# Patient Record
Sex: Male | Born: 1995 | Race: Black or African American | Hispanic: No | Marital: Single | Smoking: Current every day smoker
Health system: Southern US, Community
[De-identification: ages and names within clinical notes are randomized; demographics above are authoritative.]

## PROBLEM LIST (undated history)

## (undated) DIAGNOSIS — L309 Dermatitis, unspecified: Secondary | ICD-10-CM

## (undated) DIAGNOSIS — F1110 Opioid abuse, uncomplicated: Secondary | ICD-10-CM

## (undated) DIAGNOSIS — A539 Syphilis, unspecified: Secondary | ICD-10-CM

---
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
## 2018-05-18 ENCOUNTER — Encounter (HOSPITAL_COMMUNITY): Payer: Self-pay

## 2018-05-18 ENCOUNTER — Emergency Department (HOSPITAL_COMMUNITY): Admission: EM | Admit: 2018-05-18 | Discharge: 2018-05-18 | Disposition: A | Discharge status: Home / Self Care

## 2018-05-18 ENCOUNTER — Other Ambulatory Visit: Payer: Self-pay

## 2018-05-18 ENCOUNTER — Emergency Department (HOSPITAL_COMMUNITY)
Admission: EM | Admit: 2018-05-18 | Discharge: 2018-05-19 | Disposition: A | Discharge status: Home / Self Care | Payer: Self-pay | Attending: Emergency Medicine | Admitting: Emergency Medicine

## 2018-05-18 DIAGNOSIS — Z87891 Personal history of nicotine dependence: Secondary | ICD-10-CM | POA: Insufficient documentation

## 2018-05-18 DIAGNOSIS — R300 Dysuria: Secondary | ICD-10-CM | POA: Insufficient documentation

## 2018-05-18 DIAGNOSIS — N50812 Left testicular pain: Secondary | ICD-10-CM | POA: Insufficient documentation

## 2018-05-18 DIAGNOSIS — R319 Hematuria, unspecified: Secondary | ICD-10-CM | POA: Insufficient documentation

## 2018-05-18 DIAGNOSIS — R369 Urethral discharge, unspecified: Secondary | ICD-10-CM | POA: Insufficient documentation

## 2018-05-18 DIAGNOSIS — N451 Epididymitis: Secondary | ICD-10-CM | POA: Insufficient documentation

## 2018-05-18 LAB — COMPREHENSIVE METABOLIC PANEL
ALT: 16 U/L (ref 0–44)
AST: 21 U/L (ref 15–41)
Albumin: 4 g/dL (ref 3.5–5.0)
Alkaline Phosphatase: 50 U/L (ref 38–126)
Anion gap: 6 (ref 5–15)
BUN: 14 mg/dL (ref 6–20)
CHLORIDE: 102 mmol/L (ref 98–111)
CO2: 31 mmol/L (ref 22–32)
CREATININE: 1.24 mg/dL (ref 0.61–1.24)
Calcium: 9.3 mg/dL (ref 8.9–10.3)
GFR calc Af Amer: >60 mL/min (ref >60)
GFR calc non Af Amer: >60 mL/min (ref >60)
Glucose, Bld: 86 mg/dL (ref 70–99)
Potassium: 3.9 mmol/L (ref 3.5–5.1)
SODIUM: 139 mmol/L (ref 135–145)
Total Bilirubin: 0.4 mg/dL (ref 0.3–1.2)
Total Protein: 8.2 g/dL — ABNORMAL HIGH (ref 6.5–8.1)

## 2018-05-18 LAB — CBC
HCT: 44.8 % (ref 39.0–52.0)
HEMOGLOBIN: 14.7 g/dL (ref 13.0–17.0)
MCH: 30.2 pg (ref 26.0–34.0)
MCHC: 32.8 g/dL (ref 30.0–36.0)
MCV: 92.2 fL (ref 80.0–100.0)
NRBC: 0 % (ref 0.0–0.2)
PLATELETS: 316 10*3/uL (ref 150–400)
RBC: 4.86 MIL/uL (ref 4.22–5.81)
RDW: 13.2 % (ref 11.5–15.5)
WBC: 14.9 10*3/uL — ABNORMAL HIGH (ref 4.0–10.5)

## 2018-05-18 LAB — URINALYSIS, ROUTINE W REFLEX MICROSCOPIC
BILIRUBIN URINE: NEGATIVE
GLUCOSE, UA: NEGATIVE mg/dL
Ketones, ur: NEGATIVE mg/dL
NITRITE: NEGATIVE
PROTEIN: 30 mg/dL — AB
Specific Gravity, Urine: 1.02 (ref 1.005–1.030)
pH: 6 (ref 5.0–8.0)

## 2018-05-18 LAB — LIPASE, BLOOD: LIPASE: 22 U/L (ref 11–51)

## 2018-05-18 MED ORDER — OXYCODONE-ACETAMINOPHEN 5-325 MG PO TABS
1.0000 | ORAL_TABLET | Freq: Once | ORAL | Status: AC
  Administered 2018-05-18: 1 via ORAL
  Filled 2018-05-18: qty 1

## 2018-05-18 NOTE — ED Triage Notes (Signed)
Pt here for hematuria for the last 2 days, now having bilateral flank pain. No burning with urination.  A&Ox4.
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
## 2018-05-18 NOTE — ED Notes (Signed)
Pt's name called for triage and pt stated that he wanted to wait to be seen cause he was going to get something. Pt informed we would skip and keep moving down the list.

## 2018-05-19 ENCOUNTER — Emergency Department (HOSPITAL_COMMUNITY): Payer: Self-pay

## 2018-05-19 LAB — GC/CHLAMYDIA PROBE AMP (~~LOC~~) NOT AT ARMC
Chlamydia: POSITIVE — AB
Neisseria Gonorrhea: POSITIVE — AB

## 2018-05-19 MED ORDER — DOXYCYCLINE HYCLATE 100 MG PO CAPS: 100.0000 mg | ORAL_CAPSULE | Freq: Two times a day (BID) | ORAL | 0 refills | Status: AC

## 2018-05-19 MED ORDER — HYDROCODONE-ACETAMINOPHEN 5-325 MG PO TABS: 1.0000 | ORAL_TABLET | ORAL | 0 refills | Status: DC | PRN

## 2018-05-19 MED ORDER — NAPROXEN 375 MG PO TABS: 375.0000 mg | ORAL_TABLET | Freq: Two times a day (BID) | ORAL | 0 refills | Status: AC | PRN

## 2018-05-19 MED ORDER — AZITHROMYCIN 250 MG PO TABS
1000.0000 mg | ORAL_TABLET | Freq: Once | ORAL | Status: AC
  Administered 2018-05-19: 1000 mg via ORAL
  Filled 2018-05-19: qty 4

## 2018-05-19 MED ORDER — KETOROLAC TROMETHAMINE 60 MG/2ML IM SOLN
60.0000 mg | Freq: Once | INTRAMUSCULAR | Status: AC
  Administered 2018-05-19: 60 mg via INTRAMUSCULAR
  Filled 2018-05-19: qty 2

## 2018-05-19 MED ORDER — CEFTRIAXONE SODIUM 250 MG IJ SOLR
250.0000 mg | Freq: Once | INTRAMUSCULAR | Status: AC
  Administered 2018-05-19: 250 mg via INTRAMUSCULAR
  Filled 2018-05-19: qty 250

## 2018-05-19 NOTE — Discharge Instructions (Addendum)

## 2018-05-19 NOTE — ED Provider Notes (Signed)
22 year old male here with testicular pain.  Patient care assumed.  Testicular ultrasound obtained and shows uncomplicated epididymitis.  He has a history of STIs.  Will treat empirically, discussed safe sex precautions, and discharged with outpatient treatment.   Shaune PollackIsaacs, Kennadie Brenner, MD 05/19/18 [phone]

## 2018-05-19 NOTE — ED Notes (Signed)
Patient verbalizes understanding of discharge instructions. Opportunity for questioning and answers were provided. Armband removed by staff, pt discharged from ED ambulatory. Pt refused discharge vital signs.  

## 2018-05-19 NOTE — ED Notes (Signed)
Patient transported to Ultrasound 

## 2018-10-12 ENCOUNTER — Other Ambulatory Visit: Payer: Self-pay

## 2018-10-12 ENCOUNTER — Emergency Department (HOSPITAL_COMMUNITY)
Admission: EM | Admit: 2018-10-12 | Discharge: 2018-10-12 | Disposition: A | Discharge status: Home / Self Care | Payer: HRSA Program | Attending: Emergency Medicine | Admitting: Emergency Medicine

## 2018-10-12 ENCOUNTER — Encounter (HOSPITAL_COMMUNITY): Payer: Self-pay | Admitting: Emergency Medicine

## 2018-10-12 DIAGNOSIS — J020 Streptococcal pharyngitis: Secondary | ICD-10-CM | POA: Insufficient documentation

## 2018-10-12 DIAGNOSIS — R109 Unspecified abdominal pain: Secondary | ICD-10-CM | POA: Diagnosis not present

## 2018-10-12 DIAGNOSIS — F17210 Nicotine dependence, cigarettes, uncomplicated: Secondary | ICD-10-CM | POA: Insufficient documentation

## 2018-10-12 DIAGNOSIS — M7918 Myalgia, other site: Secondary | ICD-10-CM

## 2018-10-12 DIAGNOSIS — J029 Acute pharyngitis, unspecified: Secondary | ICD-10-CM | POA: Diagnosis present

## 2018-10-12 DIAGNOSIS — Z20828 Contact with and (suspected) exposure to other viral communicable diseases: Secondary | ICD-10-CM | POA: Diagnosis not present

## 2018-10-12 LAB — URINALYSIS, ROUTINE W REFLEX MICROSCOPIC
Bilirubin Urine: NEGATIVE
Glucose, UA: NEGATIVE mg/dL
Hgb urine dipstick: NEGATIVE
Ketones, ur: 20 mg/dL — AB
Leukocytes,Ua: NEGATIVE
Nitrite: NEGATIVE
Protein, ur: NEGATIVE mg/dL
Specific Gravity, Urine: 1.03 (ref 1.005–1.030)
pH: 5 (ref 5.0–8.0)

## 2018-10-12 LAB — GROUP A STREP BY PCR: Group A Strep by PCR: DETECTED — AB

## 2018-10-12 MED ORDER — DEXAMETHASONE SODIUM PHOSPHATE 10 MG/ML IJ SOLN
10.0000 mg | Freq: Once | INTRAMUSCULAR | Status: AC
  Administered 2018-10-12: 10 mg via INTRAMUSCULAR
  Filled 2018-10-12: qty 1

## 2018-10-12 MED ORDER — PENICILLIN G BENZATHINE 1200000 UNIT/2ML IM SUSP
1.2000 10*6.[IU] | Freq: Once | INTRAMUSCULAR | Status: AC
  Administered 2018-10-12: 1.2 10*6.[IU] via INTRAMUSCULAR
  Filled 2018-10-12: qty 2

## 2018-10-12 MED ORDER — KETOROLAC TROMETHAMINE 15 MG/ML IJ SOLN
15.0000 mg | Freq: Once | INTRAMUSCULAR | Status: AC
  Administered 2018-10-12: 15 mg via INTRAMUSCULAR
  Filled 2018-10-12: qty 1
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
## 2018-10-12 NOTE — Discharge Instructions (Addendum)
You were diagnosed with strep throat. You were given antibiotics in the ED.   Get help right away if: You have trouble breathing. You cannot swallow fluids, soft foods, or your saliva. You have swelling in your throat or neck that gets worse. You keep feeling sick to your stomach (nauseous). You keep throwing up (vomiting).

## 2018-10-12 NOTE — ED Triage Notes (Signed)
Pt here via [facility], reports sore throat and lower back pain x 2 days.

## 2018-10-13 LAB — NOVEL CORONAVIRUS, NAA (HOSP ORDER, SEND-OUT TO REF LAB; TAT 18-24 HRS): SARS-CoV-2, NAA: NOT DETECTED

## 2018-11-02 ENCOUNTER — Emergency Department (HOSPITAL_COMMUNITY)
Admission: EM | Admit: 2018-11-02 | Discharge: 2018-11-02 | Disposition: A | Discharge status: Home / Self Care | Payer: Self-pay | Attending: Emergency Medicine | Admitting: Emergency Medicine

## 2018-11-02 ENCOUNTER — Encounter (HOSPITAL_COMMUNITY): Payer: Self-pay | Admitting: Emergency Medicine

## 2018-11-02 ENCOUNTER — Other Ambulatory Visit: Payer: Self-pay

## 2018-11-02 ENCOUNTER — Emergency Department (HOSPITAL_COMMUNITY): Payer: Self-pay

## 2018-11-02 DIAGNOSIS — T401X1A Poisoning by heroin, accidental (unintentional), initial encounter: Secondary | ICD-10-CM | POA: Insufficient documentation

## 2018-11-02 DIAGNOSIS — R0789 Other chest pain: Secondary | ICD-10-CM | POA: Insufficient documentation

## 2018-11-02 DIAGNOSIS — F17210 Nicotine dependence, cigarettes, uncomplicated: Secondary | ICD-10-CM | POA: Insufficient documentation

## 2018-11-02 HISTORY — DX: Opioid abuse, uncomplicated: F11.10

## 2018-11-02 MED ORDER — NALOXONE HCL 4 MG/0.1ML NA LIQD: NASAL | 0 refills | Status: DC

## 2018-11-02 NOTE — ED Notes (Signed)
Patient verbalizes understanding of discharge instructions. Opportunity for questioning and answering were provided. patient discharged from ED. Pt alert and oriented x 4 , following simple commands and walking with strong and steady gait
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
## 2018-11-02 NOTE — ED Notes (Signed)
Roosvelt Maser ( pt.'s friend) : Tel. [phone].
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
## 2019-11-02 ENCOUNTER — Encounter (HOSPITAL_COMMUNITY): Payer: Self-pay | Admitting: Emergency Medicine

## 2019-11-02 ENCOUNTER — Emergency Department (HOSPITAL_COMMUNITY)
Admission: EM | Admit: 2019-11-02 | Discharge: 2019-11-02 | Disposition: A | Discharge status: Home / Self Care | Payer: Self-pay | Attending: Emergency Medicine | Admitting: Emergency Medicine

## 2019-11-02 ENCOUNTER — Other Ambulatory Visit: Payer: Self-pay

## 2019-11-02 DIAGNOSIS — Z7151 Drug abuse counseling and surveillance of drug abuser: Secondary | ICD-10-CM | POA: Insufficient documentation

## 2019-11-02 DIAGNOSIS — F1910 Other psychoactive substance abuse, uncomplicated: Secondary | ICD-10-CM | POA: Insufficient documentation

## 2019-11-02 DIAGNOSIS — F17210 Nicotine dependence, cigarettes, uncomplicated: Secondary | ICD-10-CM | POA: Insufficient documentation

## 2019-11-02 DIAGNOSIS — Z022 Encounter for examination for admission to residential institution: Secondary | ICD-10-CM | POA: Insufficient documentation

## 2019-11-02 DIAGNOSIS — R0789 Other chest pain: Secondary | ICD-10-CM | POA: Insufficient documentation

## 2019-11-02 LAB — COMPREHENSIVE METABOLIC PANEL
ALT: 307 U/L — ABNORMAL HIGH (ref 0–44)
AST: 123 U/L — ABNORMAL HIGH (ref 15–41)
Albumin: 4.3 g/dL (ref 3.5–5.0)
Alkaline Phosphatase: 54 U/L (ref 38–126)
Anion gap: 13 (ref 5–15)
BUN: 13 mg/dL (ref 6–20)
CO2: 21 mmol/L — ABNORMAL LOW (ref 22–32)
Calcium: 9.7 mg/dL (ref 8.9–10.3)
Chloride: 103 mmol/L (ref 98–111)
Creatinine, Ser: 1.18 mg/dL (ref 0.61–1.24)
GFR calc Af Amer: >60 mL/min (ref >60)
GFR calc non Af Amer: >60 mL/min (ref >60)
Glucose, Bld: 92 mg/dL (ref 70–99)
Potassium: 3.6 mmol/L (ref 3.5–5.1)
Sodium: 137 mmol/L (ref 135–145)
Total Bilirubin: 0.8 mg/dL (ref 0.3–1.2)
Total Protein: 8.5 g/dL — ABNORMAL HIGH (ref 6.5–8.1)

## 2019-11-02 LAB — CBC
HCT: 47.4 % (ref 39.0–52.0)
Hemoglobin: 16 g/dL (ref 13.0–17.0)
MCH: 31.3 pg (ref 26.0–34.0)
MCHC: 33.8 g/dL (ref 30.0–36.0)
MCV: 92.6 fL (ref 80.0–100.0)
Platelets: 356 10*3/uL (ref 150–400)
RBC: 5.12 MIL/uL (ref 4.22–5.81)
RDW: 12.6 % (ref 11.5–15.5)
WBC: 8.6 10*3/uL (ref 4.0–10.5)
nRBC: 0 % (ref 0.0–0.2)

## 2019-11-02 LAB — RAPID URINE DRUG SCREEN, HOSP PERFORMED
Amphetamines: POSITIVE — AB
Barbiturates: NOT DETECTED
Benzodiazepines: NOT DETECTED
Cocaine: POSITIVE — AB
Opiates: NOT DETECTED
Tetrahydrocannabinol: POSITIVE — AB

## 2019-11-02 LAB — SALICYLATE LEVEL: Salicylate Lvl: <7 mg/dL — ABNORMAL LOW (ref 7.0–30.0)

## 2019-11-02 LAB — ACETAMINOPHEN LEVEL: Acetaminophen (Tylenol), Serum: <10 ug/mL — ABNORMAL LOW (ref 10–30)

## 2019-11-02 LAB — ETHANOL: Alcohol, Ethyl (B): <10 mg/dL (ref <10)

## 2019-11-02 NOTE — Discharge Instructions (Addendum)
Patient is medically clear at this time to pursue drug rehab/detox. Patient appears to be very motivated.  Please accept him to rehab facility for detoxification.

## 2019-11-02 NOTE — ED Triage Notes (Signed)
Patient arrives to ED wanting to detox. Patient has reached out to places to help with his detox but they want him medically cleared. 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 Last use this morning.

## 2019-11-02 NOTE — ED Notes (Signed)
Patient verbalizes understanding of discharge instructions. Opportunity for questioning and answers were provided. Armband removed by staff, pt discharged from ED to home 
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
## 2019-11-16 ENCOUNTER — Emergency Department (HOSPITAL_COMMUNITY)
Admission: EM | Admit: 2019-11-16 | Discharge: 2019-11-17 | Disposition: A | Discharge status: Home / Self Care | Payer: Medicaid Other | Attending: Emergency Medicine | Admitting: Emergency Medicine

## 2019-11-16 ENCOUNTER — Encounter (HOSPITAL_COMMUNITY): Payer: Self-pay | Admitting: Emergency Medicine

## 2019-11-16 ENCOUNTER — Other Ambulatory Visit: Payer: Self-pay

## 2019-11-16 DIAGNOSIS — F17210 Nicotine dependence, cigarettes, uncomplicated: Secondary | ICD-10-CM | POA: Insufficient documentation

## 2019-11-16 DIAGNOSIS — F19129 Other psychoactive substance abuse with intoxication, unspecified: Secondary | ICD-10-CM | POA: Insufficient documentation

## 2019-11-16 DIAGNOSIS — Z79899 Other long term (current) drug therapy: Secondary | ICD-10-CM | POA: Insufficient documentation

## 2019-11-16 DIAGNOSIS — F1910 Other psychoactive substance abuse, uncomplicated: Secondary | ICD-10-CM

## 2019-11-16 LAB — COMPREHENSIVE METABOLIC PANEL
ALT: 198 U/L — ABNORMAL HIGH (ref 0–44)
AST: 92 U/L — ABNORMAL HIGH (ref 15–41)
Albumin: 3.9 g/dL (ref 3.5–5.0)
Alkaline Phosphatase: 63 U/L (ref 38–126)
Anion gap: 10 (ref 5–15)
BUN: 12 mg/dL (ref 6–20)
CO2: 24 mmol/L (ref 22–32)
Calcium: 8.8 mg/dL — ABNORMAL LOW (ref 8.9–10.3)
Chloride: 103 mmol/L (ref 98–111)
Creatinine, Ser: 1.19 mg/dL (ref 0.61–1.24)
GFR calc Af Amer: >60 mL/min (ref >60)
GFR calc non Af Amer: >60 mL/min (ref >60)
Glucose, Bld: 100 mg/dL — ABNORMAL HIGH (ref 70–99)
Potassium: 4 mmol/L (ref 3.5–5.1)
Sodium: 137 mmol/L (ref 135–145)
Total Bilirubin: 0.4 mg/dL (ref 0.3–1.2)
Total Protein: 7.4 g/dL (ref 6.5–8.1)

## 2019-11-16 LAB — RAPID URINE DRUG SCREEN, HOSP PERFORMED
Amphetamines: POSITIVE — AB
Barbiturates: NOT DETECTED
Benzodiazepines: NOT DETECTED
Cocaine: NOT DETECTED
Opiates: NOT DETECTED
Tetrahydrocannabinol: POSITIVE — AB

## 2019-11-16 LAB — CBC
HCT: 46.9 % (ref 39.0–52.0)
Hemoglobin: 15.7 g/dL (ref 13.0–17.0)
MCH: 31.2 pg (ref 26.0–34.0)
MCHC: 33.5 g/dL (ref 30.0–36.0)
MCV: 93.2 fL (ref 80.0–100.0)
Platelets: 352 10*3/uL (ref 150–400)
RBC: 5.03 MIL/uL (ref 4.22–5.81)
RDW: 12.7 % (ref 11.5–15.5)
WBC: 7.2 10*3/uL (ref 4.0–10.5)
nRBC: 0 % (ref 0.0–0.2)

## 2019-11-16 LAB — HIV ANTIBODY (ROUTINE TESTING W REFLEX): HIV Screen 4th Generation wRfx: NONREACTIVE

## 2019-11-16 LAB — ETHANOL: Alcohol, Ethyl (B): <10 mg/dL (ref <10)

## 2019-11-16 NOTE — ED Triage Notes (Addendum)
Pt presents to ED POV. Pt wanting to detox from amphetamines and heroin. Pt states last use yesterday. Pt states that he was sent home with medical clearance paper for facility on 6/11 but facility also wanted him to detox here. Pt also reports possible HIV exposure  COWS 7

## 2019-11-17 NOTE — Discharge Instructions (Signed)
Follow up with the resources provided to get treatment for multi-drug addiction.
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
## 2019-11-24 ENCOUNTER — Encounter (HOSPITAL_COMMUNITY): Payer: Self-pay

## 2019-11-24 ENCOUNTER — Emergency Department (HOSPITAL_COMMUNITY)
Admission: EM | Admit: 2019-11-24 | Discharge: 2019-11-24 | Disposition: A | Discharge status: Home / Self Care | Payer: Medicaid Other | Attending: Emergency Medicine | Admitting: Emergency Medicine

## 2019-11-24 ENCOUNTER — Other Ambulatory Visit: Payer: Self-pay

## 2019-11-24 DIAGNOSIS — J020 Streptococcal pharyngitis: Secondary | ICD-10-CM

## 2019-11-24 DIAGNOSIS — F17210 Nicotine dependence, cigarettes, uncomplicated: Secondary | ICD-10-CM | POA: Insufficient documentation

## 2019-11-24 DIAGNOSIS — J029 Acute pharyngitis, unspecified: Secondary | ICD-10-CM | POA: Insufficient documentation

## 2019-11-24 DIAGNOSIS — R509 Fever, unspecified: Secondary | ICD-10-CM | POA: Insufficient documentation

## 2019-11-24 LAB — GROUP A STREP BY PCR: Group A Strep by PCR: DETECTED — AB

## 2019-11-24 MED ORDER — AMOXICILLIN 500 MG PO CAPS
500.0000 mg | ORAL_CAPSULE | Freq: Two times a day (BID) | ORAL | 0 refills | Status: DC
Start: 2019-11-24 — End: 2020-01-19

## 2019-11-24 MED ORDER — AMOXICILLIN 500 MG PO CAPS
500.0000 mg | ORAL_CAPSULE | Freq: Once | ORAL | Status: AC
  Administered 2019-11-24: 500 mg via ORAL
  Filled 2019-11-24: qty 1

## 2019-11-24 MED ORDER — ACETAMINOPHEN 325 MG PO TABS
650.0000 mg | ORAL_TABLET | Freq: Once | ORAL | Status: AC
  Administered 2019-11-24: 650 mg via ORAL
  Filled 2019-11-24: qty 2
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
## 2019-11-24 NOTE — ED Triage Notes (Signed)
Pt reports sore throat and a white coating in the throat. Reports last using meth around 1p today.

## 2019-11-24 NOTE — ED Notes (Signed)
Pt able to swallow without coughing, stopping or other concerns.

## 2019-12-19 ENCOUNTER — Encounter: Payer: Medicaid Other | Admitting: Internal Medicine

## 2019-12-19 ENCOUNTER — Telehealth: Payer: Self-pay | Admitting: Pharmacy Technician

## 2019-12-19 NOTE — Telephone Encounter (Addendum)
[facility] Patient Product/process development scientist completed.    The patient is [place]-family planning) and will need patient assistance for medication.  May be insured under father's [employer].  Will need copy of card.  We can complete the application and will need to meet with the patient for signatures and income documentation.  Netty Starring. Dimas Aguas CPhT Specialty Pharmacy Patient [facility] for Infectious Disease Phone: [phone] Fax:  [phone]

## 2020-01-01 ENCOUNTER — Telehealth: Payer: Self-pay | Admitting: Pharmacy Technician

## 2020-01-01 NOTE — Telephone Encounter (Signed)
RCID Patient Product/process development scientist completed.    The patient is uninsured (may be insured under father who has [place]) and will need patient assistance for medication.  If insured, we will need a copy of the card.  We can complete the application and will need to meet with the patient for signatures and income documentation.  Netty Starring. Dimas Aguas CPhT Specialty Pharmacy Patient [facility] for Infectious Disease Phone: [phone] Fax:  [phone]

## 2020-01-02 ENCOUNTER — Ambulatory Visit (INDEPENDENT_AMBULATORY_CARE_PROVIDER_SITE_OTHER): Payer: Self-pay | Admitting: Internal Medicine

## 2020-01-02 ENCOUNTER — Other Ambulatory Visit: Payer: Self-pay

## 2020-01-02 ENCOUNTER — Encounter: Payer: Self-pay | Admitting: Internal Medicine

## 2020-01-02 DIAGNOSIS — B182 Chronic viral hepatitis C: Secondary | ICD-10-CM | POA: Insufficient documentation

## 2020-01-02 NOTE — Progress Notes (Signed)
[facility]   CC: consideration for treatment for chronic hepatitis C  HPI:  +Ronald Greene is a 24 y.o. male who presents for initial evaluation and management of chronic hepatitis C.  Patient tested positive 3-4 years ago at an outside clinic. Hepatitis C-associated risk factors present are: IV drug abuse (details: he has recently used heroin but is no longer using and feels he is in a good place to remain drug free). Patient denies history of blood transfusion. Patient has not had other studies performed. Results: none. Patient has not had prior treatment for Hepatitis C. Patient does not have a past history of liver disease. Patient does not have a family history of liver disease. Patient does not  have associated signs or symptoms related to liver disease.      Patient does not have documented immunity to Hepatitis A. Patient does not have documented immunity to Hepatitis B.    Review of Systems:  Constitutional: negative for fevers and chills Gastrointestinal: negative for nausea and diarrhea Integument/breast: negative for rash All other systems reviewed and are negative       Past Medical History:  Diagnosis Date  . Heroin abuse (HCC)     Prior to Admission medications   Medication Sig Start Date End Date Taking? Authorizing Provider  amoxicillin (AMOXIL) 500 MG capsule Take 1 capsule (500 mg total) by mouth 2 (two) times daily. Patient not taking: Reported on 01/02/2020 11/24/19   Zadie Rhine, MD  naloxone [place]) nasal spray 4 mg/0.1 mL 4 mg intranasally once for overdose Patient not taking: Reported on 01/02/2020 11/02/18   Elpidio Anis, PA-C    Allergies  Allergen Reactions  . Aloe Hives    Social History   Tobacco Use  . Smoking status: Current Every Day Smoker    Packs/day: 1.00    Types: Cigarettes    Last attempt to quit: 06/20/2012    Years since quitting: 7.5  . Smokeless tobacco: Never Used  Vaping Use  . Vaping  Use: Never used  Substance Use Topics  . Alcohol use: No  . Drug use: Yes    Types: Marijuana    Comment: Occassional use   FMH: no liver disease   Objective:  Constitutional: in no apparent distress,  Vitals:   01/02/20 1004  BP: 118/72  Pulse: 98  Temp: 97.7 F (36.5 C)  SpO2: 99%   Eyes: anicteric Cardiovascular: Cor RRR Respiratory: clear Musculoskeletal: no pedal edema noted Skin: negatives: no rash; no porphyria cutanea tarda Lymphatic: no cervical lymphadenopathy   Laboratory Genotype: No results found for: HCVGENOTYPE HCV viral load: No results found for: HCVQUANT Lab Results  Component Value Date   WBC 7.2 11/16/2019   HGB 15.7 11/16/2019   HCT 46.9 11/16/2019   MCV 93.2 11/16/2019   PLT 352 11/16/2019    Lab Results  Component Value Date   CREATININE 1.19 11/16/2019   BUN 12 11/16/2019   NA 137 11/16/2019   K 4.0 11/16/2019   CL 103 11/16/2019   CO2 24 11/16/2019    Lab Results  Component Value Date   ALT 198 (H) 11/16/2019   AST 92 (H) 11/16/2019   ALKPHOS 63 11/16/2019     Labs and history reviewed and show CHILD-PUGH unknown  5-6 points: Child class A 7-9 points: Child class B 10-15 points: Child class C  Lab Results  Component Value Date   BILITOT 0.4 11/16/2019   ALBUMIN 3.9 11/16/2019  Assessment: New Patient with Chronic Hepatitis C genotype unknown, untreated.  I discussed with the patient the lab findings that confirm chronic hepatitis C as well as the natural history and progression of disease including about 30% of people who develop cirrhosis of the liver if left untreated and once cirrhosis is established there is a 2-7% risk per year of liver cancer and liver failure.  I discussed the importance of treatment and benefits in reducing the risk, even if significant liver fibrosis exists.   Plan: 1) Patient counseled extensively on limiting acetaminophen to no more than 2 grams daily, avoidance of alcohol. 2) Transmission  discussed with patient including sexual transmission, sharing razors and toothbrush.   3) Will prescribe appropriate medication after labs have returned 4) Hepatitis A and B titers 5) will follow up after starting medication, will alert him when it is available

## 2020-01-02 NOTE — Telephone Encounter (Signed)
Confirmed [place]'s [place] family [place] and will not cover needed medication.  Gathered patient assistance applications and signatures.  Netty Starring. Dimas Aguas CPhT Specialty Pharmacy Patient [facility] for Infectious Disease Phone: [phone] Fax:  [phone]

## 2020-01-03 LAB — URINE CYTOLOGY ANCILLARY ONLY
Chlamydia: NEGATIVE
Comment: NEGATIVE
Comment: NORMAL
Neisseria Gonorrhea: NEGATIVE

## 2020-01-06 LAB — LIVER FIBROSIS, FIBROTEST-ACTITEST
ALT: 265 U/L — ABNORMAL HIGH (ref 9–46)
Alpha-2-Macroglobulin: 207 mg/dL (ref 106–279)
Apolipoprotein A1: 119 mg/dL (ref 94–176)
Bilirubin: 0.3 mg/dL (ref 0.2–1.2)
Fibrosis Score: 0.2
GGT: 53 U/L (ref 3–70)
Haptoglobin: 73 mg/dL (ref 43–212)
Necroinflammat ACT Score: 0.85
Reference ID: 3511597

## 2020-01-06 LAB — HEPATITIS B CORE ANTIBODY, TOTAL: Hep B Core Total Ab: NONREACTIVE

## 2020-01-06 LAB — RPR: RPR Ser Ql: REACTIVE — AB

## 2020-01-06 LAB — HEPATITIS B SURFACE ANTIGEN: Hepatitis B Surface Ag: NONREACTIVE

## 2020-01-06 LAB — HIV ANTIBODY (ROUTINE TESTING W REFLEX): HIV 1&2 Ab, 4th Generation: NONREACTIVE

## 2020-01-06 LAB — RPR TITER: RPR Titer: 1:2 {titer} — ABNORMAL HIGH

## 2020-01-06 LAB — HEPATITIS C GENOTYPE

## 2020-01-06 LAB — FLUORESCENT TREPONEMAL AB(FTA)-IGG-BLD: Fluorescent Treponemal ABS: REACTIVE — AB

## 2020-01-06 LAB — HEPATITIS A ANTIBODY, TOTAL: Hepatitis A AB,Total: REACTIVE — AB

## 2020-01-06 LAB — HEPATITIS B SURFACE ANTIBODY,QUALITATIVE: Hep B S Ab: NONREACTIVE

## 2020-01-19 ENCOUNTER — Other Ambulatory Visit: Payer: Self-pay

## 2020-01-19 ENCOUNTER — Emergency Department (HOSPITAL_COMMUNITY)
Admission: EM | Admit: 2020-01-19 | Discharge: 2020-01-19 | Disposition: A | Discharge status: Home / Self Care | Payer: Medicaid Other | Attending: Emergency Medicine | Admitting: Emergency Medicine

## 2020-01-19 ENCOUNTER — Encounter (HOSPITAL_COMMUNITY): Payer: Self-pay | Admitting: Student

## 2020-01-19 DIAGNOSIS — F17210 Nicotine dependence, cigarettes, uncomplicated: Secondary | ICD-10-CM | POA: Insufficient documentation

## 2020-01-19 DIAGNOSIS — L237 Allergic contact dermatitis due to plants, except food: Secondary | ICD-10-CM

## 2020-01-19 MED ORDER — PREDNISONE 10 MG PO TABS
ORAL_TABLET | ORAL | 0 refills | Status: DC
Start: 2020-01-19 — End: 2020-10-04

## 2020-01-19 NOTE — Discharge Instructions (Addendum)
You were seen in the emergency department today for poison ivy.  We are sending you home with prednisone to take over an extended period of time.  Please take 20 mg (2 tablets) for the first 5 days and then 10 mg (1 tablet) for the next 5 days following for a total of 10 days.  We have prescribed you new medication(s) today. Discuss the medications prescribed today with your pharmacist as they can have adverse effects and interactions with your other medicines including over the counter and prescribed medications. Seek medical evaluation if you start to experience new or abnormal symptoms after taking one of these medicines, seek care immediately if you start to experience difficulty breathing, feeling of your throat closing, facial swelling, or rash as these could be indications of a more serious allergic reaction  Please try not to touch the area.  Please follow with your primary care provider within 3 days for reevaluation.  Return to the ER for new or worsening symptoms including but not limited to spreading rash, increased redness, drainage from the rash, change in your vision, eye pain, or any other concerns.
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
## 2020-01-19 NOTE — ED Triage Notes (Addendum)
Patient reports poison ivy rash starting two days ago. Patient says lotions are not working. No difficulty breathing.

## 2020-02-08 ENCOUNTER — Telehealth: Payer: Self-pay

## 2020-02-08 NOTE — Telephone Encounter (Signed)
Called patient to get a follow up scheduled, no answer or voicemail set up at this time

## 2020-02-12 ENCOUNTER — Telehealth: Payer: Self-pay | Admitting: *Deleted

## 2020-02-12 NOTE — Telephone Encounter (Signed)
Hi, Marcus. Dr Luciana Axe didn't have anything available in the dates you listed, but he does have 10/13 at 11:00.  Does that work?  If not, please call [phone]. Thank you! Chalese Peach ===View-only below this line===   ----- Message -----      From:Sadler Chilton Si      Sent:02/12/2020 12:54 AM EDT        HF:SFSELT Jackolyn Confer, MD   Subject:Appointment Request  Appointment Request From: Crist Fat  With Provider: Gardiner Barefoot, MD [facility] Infectious Disease]  Preferred Date Range: 02/13/2020 - 03/04/2020  Preferred Times: Any Time  Reason for visit: Office Visit  Comments: Hepatitis treatment, fibrosis and anyteat results that can be treated

## 2020-03-05 ENCOUNTER — Ambulatory Visit: Payer: Medicaid Other | Admitting: Internal Medicine

## 2020-03-13 ENCOUNTER — Other Ambulatory Visit: Payer: Self-pay

## 2020-03-13 ENCOUNTER — Emergency Department (HOSPITAL_COMMUNITY)
Admission: EM | Admit: 2020-03-13 | Discharge: 2020-03-13 | Disposition: A | Discharge status: Home / Self Care | Payer: Medicaid Other | Attending: Emergency Medicine | Admitting: Emergency Medicine

## 2020-03-13 ENCOUNTER — Encounter (HOSPITAL_COMMUNITY): Payer: Self-pay | Admitting: Emergency Medicine

## 2020-03-13 DIAGNOSIS — R070 Pain in throat: Secondary | ICD-10-CM

## 2020-03-13 DIAGNOSIS — Z720 Tobacco use: Secondary | ICD-10-CM

## 2020-03-13 DIAGNOSIS — F17210 Nicotine dependence, cigarettes, uncomplicated: Secondary | ICD-10-CM | POA: Insufficient documentation

## 2020-03-13 NOTE — ED Triage Notes (Addendum)
Patient arrives to ED with complaints of sore throat, swollen lymph nodes, and swollen cheeks x1 week. Pt states it has not gotten better and he is now weak. Denies chills and fever but states he cannot swallow as easy.
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
## 2020-03-13 NOTE — Discharge Planning (Signed)
RNCM contacted Peer Support Arlys John).  Arlys John will be here to interview pt within the hour.

## 2020-03-13 NOTE — Discharge Instructions (Addendum)
The cause of your symptoms was not identified today. Please follow-up with your family doctor or [facility] for further evaluation. Get rechecked immediately if you develop difficulty breathing, difficulty swallowing or new concerning symptoms.  You can help your throat get better by stopping smoking cigarettes and marijuana.

## 2020-03-13 NOTE — Patient Outreach (Signed)
ED Peer Support Specialist Patient Intake (Complete at intake & 30-60 Day Follow-up)  Name: Ronald Greene  MRN: [id number]  Age: 24 y.o.   Date of Admission: 03/13/2020  Intake:   Comments:      Primary Reason Admitted: unknown   Lab values: Alcohol/ETOH: Not completed Positive UDS? Drug screen not completed Amphetamines: Drug screen not completed Barbiturates: Drug screen not completed Benzodiazepines: Drug screen not completed Cocaine: Drug screen not completed Opiates: Drug screen not completed Cannabinoids: Drug screen not completed  Demographic information: Gender: Male Ethnicity: African American Marital Status: Single Insurance Status: Uninsured/Self-pay [employer] (Work Neurosurgeon, Physicist, medical, etc.: Yes Hospital doctor) Lives with: Partner/Spouse Living situation: House/Apartment  Reported Patient History: Patient reported health conditions: None Patient aware of HIV and hepatitis status: No  In past year, has patient visited ED for any reason? No  Number of ED visits:    Reason(s) for visit:    In past year, has patient been hospitalized for any reason? No  Number of hospitalizations:    Reason(s) for hospitalization:    In past year, has patient been arrested? No  Number of arrests:    Reason(s) for arrest:    In past year, has patient been incarcerated? No  Number of incarcerations:    Reason(s) for incarceration:    In past year, has patient received medication-assisted treatment? No  In past year, patient received the following treatments:    In past year, has patient received any harm reduction services? No  Did this include any of the following?    In past year, has patient received care from a mental health provider for diagnosis other than SUD? No  In past year, is this first time patient has overdosed? No  Number of past overdoses:    In past year, is this first time patient has been hospitalized for  an overdose? No  Number of hospitalizations for overdose(s):    Is patient currently receiving treatment for a mental health diagnosis? No  Patient reports experiencing difficulty participating in SUD treatment: No    Most important reason(s) for this difficulty?    Has patient received prior services for treatment? No  In past, patient has received services from following agencies:    Plan of Care:  Suggested follow up at these agencies/treatment centers: Other (comment)  Other information: [facility]PSS met with Pt to monitor services. [facility]PSS was able to complete series of questions to gain more information. [facility]PSS was made aware that Pt was having sore throat issues. CPSS left contact information for Pt if needed for any resources in community.   [name], CPSS  03/13/2020 11:46 AM

## 2020-03-22 ENCOUNTER — Other Ambulatory Visit: Payer: Self-pay

## 2020-03-22 ENCOUNTER — Emergency Department (HOSPITAL_COMMUNITY): Payer: Self-pay

## 2020-03-22 ENCOUNTER — Emergency Department (HOSPITAL_COMMUNITY)
Admission: EM | Admit: 2020-03-22 | Discharge: 2020-03-22 | Disposition: A | Discharge status: Home / Self Care | Payer: Self-pay | Attending: Emergency Medicine | Admitting: Emergency Medicine

## 2020-03-22 ENCOUNTER — Encounter (HOSPITAL_COMMUNITY): Payer: Self-pay | Admitting: Emergency Medicine

## 2020-03-22 DIAGNOSIS — F1590 Other stimulant use, unspecified, uncomplicated: Secondary | ICD-10-CM | POA: Insufficient documentation

## 2020-03-22 DIAGNOSIS — F17210 Nicotine dependence, cigarettes, uncomplicated: Secondary | ICD-10-CM | POA: Insufficient documentation

## 2020-03-22 DIAGNOSIS — R52 Pain, unspecified: Secondary | ICD-10-CM

## 2020-03-22 DIAGNOSIS — L03011 Cellulitis of right finger: Secondary | ICD-10-CM | POA: Insufficient documentation

## 2020-03-22 MED ORDER — CEPHALEXIN 500 MG PO CAPS: 500.0000 mg | ORAL_CAPSULE | Freq: Three times a day (TID) | ORAL | 0 refills | Status: AC

## 2020-03-22 MED ORDER — LIDOCAINE HCL (PF) 1 % IJ SOLN
5.0000 mL | Freq: Once | INTRAMUSCULAR | Status: AC
  Administered 2020-03-22: 5 mL
  Filled 2020-03-22: qty 5
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
## 2020-03-22 NOTE — ED Notes (Signed)
Pt thumb dressed in gauze and tape.

## 2020-03-22 NOTE — ED Triage Notes (Addendum)
C/o pain, swelling, and discoloration to R thumb (including nail bed) x 4 days.  States he believes he may have been bit by a spider.  Denies fever and chills.

## 2020-05-08 ENCOUNTER — Emergency Department (HOSPITAL_COMMUNITY)
Admission: EM | Admit: 2020-05-08 | Discharge: 2020-05-08 | Disposition: A | Discharge status: Home / Self Care | Payer: Medicaid Other | Attending: Emergency Medicine | Admitting: Emergency Medicine

## 2020-05-08 ENCOUNTER — Encounter (HOSPITAL_COMMUNITY): Payer: Self-pay

## 2020-05-08 ENCOUNTER — Other Ambulatory Visit: Payer: Self-pay

## 2020-05-08 DIAGNOSIS — L02413 Cutaneous abscess of right upper limb: Secondary | ICD-10-CM | POA: Insufficient documentation

## 2020-05-08 DIAGNOSIS — F17210 Nicotine dependence, cigarettes, uncomplicated: Secondary | ICD-10-CM | POA: Insufficient documentation

## 2020-05-08 MED ORDER — LIDOCAINE-EPINEPHRINE 1 %-1:100000 IJ SOLN
10.0000 mL | Freq: Once | INTRAMUSCULAR | Status: DC
  Filled 2020-05-08: qty 1

## 2020-05-08 MED ORDER — DOXYCYCLINE HYCLATE 100 MG PO CAPS: 100.0000 mg | ORAL_CAPSULE | Freq: Two times a day (BID) | ORAL | 0 refills | Status: DC

## 2020-05-08 NOTE — ED Triage Notes (Signed)
patient complains of right forearm swelling with redness x 3 days, patient concerned that he was bitten by something. No drainage.
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
## 2020-05-08 NOTE — Discharge Instructions (Signed)
Apply warm compresses to the area for at a time to draw out the rest of the pus.  Take the antibiotics till gone

## 2020-10-04 ENCOUNTER — Encounter (HOSPITAL_COMMUNITY): Payer: Self-pay

## 2020-10-04 ENCOUNTER — Other Ambulatory Visit: Payer: Self-pay

## 2020-10-04 ENCOUNTER — Emergency Department (HOSPITAL_COMMUNITY)
Admission: EM | Admit: 2020-10-04 | Discharge: 2020-10-04 | Disposition: A | Discharge status: Home / Self Care | Payer: Medicaid Other | Attending: Emergency Medicine | Admitting: Emergency Medicine

## 2020-10-04 DIAGNOSIS — L02416 Cutaneous abscess of left lower limb: Secondary | ICD-10-CM | POA: Insufficient documentation

## 2020-10-04 DIAGNOSIS — S70262A Insect bite (nonvenomous), left hip, initial encounter: Secondary | ICD-10-CM | POA: Insufficient documentation

## 2020-10-04 DIAGNOSIS — F17210 Nicotine dependence, cigarettes, uncomplicated: Secondary | ICD-10-CM | POA: Insufficient documentation

## 2020-10-04 DIAGNOSIS — W57XXXA Bitten or stung by nonvenomous insect and other nonvenomous arthropods, initial encounter: Secondary | ICD-10-CM | POA: Insufficient documentation

## 2020-10-04 DIAGNOSIS — L0291 Cutaneous abscess, unspecified: Secondary | ICD-10-CM

## 2020-10-04 MED ORDER — DOXYCYCLINE HYCLATE 100 MG PO TABS
100.0000 mg | ORAL_TABLET | Freq: Once | ORAL | Status: AC
  Administered 2020-10-04: 100 mg via ORAL
  Filled 2020-10-04: qty 1

## 2020-10-04 MED ORDER — DOXYCYCLINE HYCLATE 100 MG PO CAPS: 100.0000 mg | ORAL_CAPSULE | Freq: Two times a day (BID) | ORAL | 0 refills | Status: DC
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
## 2020-12-06 ENCOUNTER — Encounter (HOSPITAL_COMMUNITY): Payer: Self-pay | Admitting: Emergency Medicine

## 2020-12-06 ENCOUNTER — Emergency Department (HOSPITAL_COMMUNITY)
Admission: EM | Admit: 2020-12-06 | Discharge: 2020-12-06 | Disposition: A | Discharge status: Home / Self Care | Payer: Medicaid Other | Attending: Emergency Medicine | Admitting: Emergency Medicine

## 2020-12-06 ENCOUNTER — Other Ambulatory Visit: Payer: Self-pay

## 2020-12-06 DIAGNOSIS — R21 Rash and other nonspecific skin eruption: Secondary | ICD-10-CM | POA: Insufficient documentation

## 2020-12-06 DIAGNOSIS — Z711 Person with feared health complaint in whom no diagnosis is made: Secondary | ICD-10-CM

## 2020-12-06 DIAGNOSIS — Z202 Contact with and (suspected) exposure to infections with a predominantly sexual mode of transmission: Secondary | ICD-10-CM | POA: Insufficient documentation

## 2020-12-06 DIAGNOSIS — F17210 Nicotine dependence, cigarettes, uncomplicated: Secondary | ICD-10-CM | POA: Insufficient documentation

## 2020-12-06 LAB — URINALYSIS, ROUTINE W REFLEX MICROSCOPIC
Bilirubin Urine: NEGATIVE
Glucose, UA: NEGATIVE mg/dL
Hgb urine dipstick: NEGATIVE
Ketones, ur: NEGATIVE mg/dL
Leukocytes,Ua: NEGATIVE
Nitrite: NEGATIVE
Protein, ur: NEGATIVE mg/dL
Specific Gravity, Urine: 1.03 (ref 1.005–1.030)
pH: 5 (ref 5.0–8.0)

## 2020-12-06 NOTE — ED Notes (Signed)
Pt d/c home per MD order. Discharge summary reviewed with pt, pt verbalizes understanding. No s/s of acute distress noted at discharge. Ambulatory off unit.  
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
## 2020-12-06 NOTE — Discharge Instructions (Signed)
You were seen in the emergency department for evaluation of possible herpes exposure.  You also agreed to other STD testing.  These results are pending at time of discharge.  You should use barrier protection until the results are back.  You can follow these in MyChart and we will call you if anything is positive.

## 2020-12-06 NOTE — ED Triage Notes (Signed)
Pt from home, here for STD testing.

## 2020-12-07 LAB — RPR: RPR Ser Ql: NONREACTIVE

## 2020-12-07 LAB — HIV ANTIBODY (ROUTINE TESTING W REFLEX): HIV Screen 4th Generation wRfx: NONREACTIVE

## 2020-12-08 LAB — GC/CHLAMYDIA PROBE AMP (~~LOC~~) NOT AT ARMC
Chlamydia: NEGATIVE
Comment: NEGATIVE
Comment: NORMAL
Neisseria Gonorrhea: NEGATIVE

## 2020-12-09 LAB — HSV CULTURE AND TYPING

## 2020-12-11 ENCOUNTER — Ambulatory Visit: Payer: Self-pay | Admitting: *Deleted

## 2020-12-12 ENCOUNTER — Telehealth: Payer: Self-pay

## 2020-12-12 NOTE — Telephone Encounter (Signed)
Attempted to call patient to schedule appt for STD screening after the following message; "Between my visit at [facility] come and now I have broken out with small white bumps on my genitalia, inner thighs and random locations around my lower stomach. I don't think I paid close enough attention and didn't have any advice of what I was looking for but I have proofe (via text) of the person telling they had a out break during our encounter. I hope if I get another visit that we can make progress with finding the problem and stopping it " Left voicemail requesting call back.  Juanita Laster, RMA

## 2020-12-12 NOTE — Telephone Encounter (Signed)
Prior to talking with patient , agent assisted patient with setting up My Chart access instead. NT did not speak to patient.

## 2020-12-12 NOTE — Telephone Encounter (Signed)
Pt. Asking about additional testing for STD's. Given [facility] as a resource. Verbalizes understanding.

## 2020-12-19 ENCOUNTER — Telehealth: Payer: Self-pay

## 2020-12-19 ENCOUNTER — Ambulatory Visit: Payer: Self-pay | Admitting: Internal Medicine

## 2020-12-19 NOTE — Telephone Encounter (Signed)
Left VM asking patient to call our office back to reschedule missed appointment today if needed to be seen.   Kristoph Sattler Lesli Albee, CMA

## 2020-12-21 ENCOUNTER — Emergency Department (HOSPITAL_COMMUNITY)
Admission: EM | Admit: 2020-12-21 | Discharge: 2020-12-21 | Disposition: A | Discharge status: Home / Self Care | Payer: Self-pay | Attending: Emergency Medicine | Admitting: Emergency Medicine

## 2020-12-21 ENCOUNTER — Emergency Department (HOSPITAL_COMMUNITY): Payer: Self-pay

## 2020-12-21 ENCOUNTER — Encounter (HOSPITAL_COMMUNITY): Payer: Self-pay | Admitting: Emergency Medicine

## 2020-12-21 ENCOUNTER — Emergency Department (HOSPITAL_COMMUNITY)
Admission: EM | Admit: 2020-12-21 | Discharge: 2020-12-21 | Discharge status: Against Medical Advice | Payer: Self-pay | Attending: Emergency Medicine | Admitting: Emergency Medicine

## 2020-12-21 ENCOUNTER — Other Ambulatory Visit: Payer: Self-pay

## 2020-12-21 DIAGNOSIS — X58XXXA Exposure to other specified factors, initial encounter: Secondary | ICD-10-CM | POA: Insufficient documentation

## 2020-12-21 DIAGNOSIS — S81811A Laceration without foreign body, right lower leg, initial encounter: Secondary | ICD-10-CM | POA: Insufficient documentation

## 2020-12-21 DIAGNOSIS — L089 Local infection of the skin and subcutaneous tissue, unspecified: Secondary | ICD-10-CM | POA: Insufficient documentation

## 2020-12-21 DIAGNOSIS — Z5321 Procedure and treatment not carried out due to patient leaving prior to being seen by health care provider: Secondary | ICD-10-CM | POA: Insufficient documentation

## 2020-12-21 DIAGNOSIS — F17210 Nicotine dependence, cigarettes, uncomplicated: Secondary | ICD-10-CM | POA: Insufficient documentation

## 2020-12-21 DIAGNOSIS — L0390 Cellulitis, unspecified: Secondary | ICD-10-CM | POA: Insufficient documentation

## 2020-12-21 DIAGNOSIS — W25XXXA Contact with sharp glass, initial encounter: Secondary | ICD-10-CM | POA: Insufficient documentation

## 2020-12-21 DIAGNOSIS — S81011A Laceration without foreign body, right knee, initial encounter: Secondary | ICD-10-CM | POA: Insufficient documentation

## 2020-12-21 MED ORDER — DOXYCYCLINE HYCLATE 100 MG PO CAPS: 100.0000 mg | ORAL_CAPSULE | Freq: Two times a day (BID) | ORAL | 0 refills | Status: AC

## 2020-12-21 MED ORDER — DOXYCYCLINE HYCLATE 100 MG PO TABS
100.0000 mg | ORAL_TABLET | Freq: Once | ORAL | Status: AC
  Administered 2020-12-21: 100 mg via ORAL
  Filled 2020-12-21: qty 1

## 2020-12-21 NOTE — ED Notes (Signed)
Called pt x3 for vitals. No response. Pulling pt OTF at this time.

## 2020-12-21 NOTE — ED Provider Notes (Signed)
Emergency Medicine Provider Triage Evaluation Note  Ronald Greene , a 25 y.o. male  was evaluated in triage.  Pt complains of right knee pain.  States he cut it 2 days ago.  Increased pain with walking.  Tetanus UTD.  Review of Systems  Positive: Right knee pain Negative: numbness  Physical Exam  BP 119/80 (BP Location: Right Arm)   Pulse 83   Temp 98 F (36.7 C) (Oral)   Resp 14   Ht 5\' 9"  (1.753 m)   Wt 88.5 kg   SpO2 99%   BMI 28.80 kg/m   Gen:   Awake, no distress   Resp:  Normal effort  MSK:   Ambulatory but limping, dried blood present on pants over right knee but too tight to pull up so cannot visualize wound in triage Other:    Medical Decision Making  Medically screening exam initiated at 4:19 AM.  Appropriate orders placed.  Gus Cheema was informed that the remainder of the evaluation will be completed by another provider, this initial triage assessment does not replace that evaluation, and the importance of remaining in the ED until their evaluation is complete.  Will order x-ray.   Chilton Si, PA-C 12/21/20 0422    12/23/20, MD 12/21/20 [phone]
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
## 2020-12-21 NOTE — ED Triage Notes (Signed)
Patient here for knee pain.  Patient states that he has open sores on his arms and knees.  Right knee is draining.  Patient states that he has them on his legs and arms too.

## 2021-02-01 ENCOUNTER — Emergency Department (HOSPITAL_COMMUNITY)
Admission: EM | Admit: 2021-02-01 | Discharge: 2021-02-02 | Disposition: A | Discharge status: Home / Self Care | Payer: Self-pay | Attending: Emergency Medicine | Admitting: Emergency Medicine

## 2021-02-01 ENCOUNTER — Other Ambulatory Visit: Payer: Self-pay

## 2021-02-01 ENCOUNTER — Encounter (HOSPITAL_COMMUNITY): Payer: Self-pay | Admitting: Emergency Medicine

## 2021-02-01 ENCOUNTER — Emergency Department (HOSPITAL_COMMUNITY): Payer: Self-pay

## 2021-02-01 DIAGNOSIS — T40604A Poisoning by unspecified narcotics, undetermined, initial encounter: Secondary | ICD-10-CM

## 2021-02-01 DIAGNOSIS — Z79899 Other long term (current) drug therapy: Secondary | ICD-10-CM | POA: Insufficient documentation

## 2021-02-01 DIAGNOSIS — R4020 Unspecified coma: Secondary | ICD-10-CM | POA: Insufficient documentation

## 2021-02-01 DIAGNOSIS — Y900 Blood alcohol level of less than 20 mg/100 ml: Secondary | ICD-10-CM | POA: Insufficient documentation

## 2021-02-01 DIAGNOSIS — F17210 Nicotine dependence, cigarettes, uncomplicated: Secondary | ICD-10-CM | POA: Insufficient documentation

## 2021-02-01 NOTE — ED Notes (Signed)
X-ray at bedside
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
## 2021-02-01 NOTE — ED Triage Notes (Signed)
Pt BIB EMS from [employer] for overdose. Pt admitted to injecting fentanyl around 2230 tonight. Upon ems arrival, pt unresponsive, apneic, pinpoint pupils. 2 mg narcan IN administered. A&o x 4 and VSS now. 20G RAC. Hx of IV drug use.

## 2021-02-02 ENCOUNTER — Encounter (HOSPITAL_COMMUNITY): Payer: Self-pay

## 2021-02-02 ENCOUNTER — Emergency Department (HOSPITAL_COMMUNITY): Payer: Self-pay

## 2021-02-02 LAB — RAPID URINE DRUG SCREEN, HOSP PERFORMED
Amphetamines: POSITIVE — AB
Barbiturates: NOT DETECTED
Benzodiazepines: NOT DETECTED
Cocaine: NOT DETECTED
Opiates: POSITIVE — AB
Tetrahydrocannabinol: POSITIVE — AB

## 2021-02-02 LAB — COMPREHENSIVE METABOLIC PANEL
ALT: 185 U/L — ABNORMAL HIGH (ref 0–44)
AST: 131 U/L — ABNORMAL HIGH (ref 15–41)
Albumin: 3.7 g/dL (ref 3.5–5.0)
Alkaline Phosphatase: 58 U/L (ref 38–126)
Anion gap: 8 (ref 5–15)
BUN: 15 mg/dL (ref 6–20)
CO2: 29 mmol/L (ref 22–32)
Calcium: 8.9 mg/dL (ref 8.9–10.3)
Chloride: 104 mmol/L (ref 98–111)
Creatinine, Ser: 0.97 mg/dL (ref 0.61–1.24)
GFR, Estimated: >60 mL/min (ref >60)
Glucose, Bld: 99 mg/dL (ref 70–99)
Potassium: 3.6 mmol/L (ref 3.5–5.1)
Sodium: 141 mmol/L (ref 135–145)
Total Bilirubin: 0.5 mg/dL (ref 0.3–1.2)
Total Protein: 7.5 g/dL (ref 6.5–8.1)

## 2021-02-02 LAB — CBC WITH DIFFERENTIAL/PLATELET
Abs Immature Granulocytes: 0.05 10*3/uL (ref 0.00–0.07)
Basophils Absolute: 0 10*3/uL (ref 0.0–0.1)
Basophils Relative: 0 %
Eosinophils Absolute: 0.2 10*3/uL (ref 0.0–0.5)
Eosinophils Relative: 2 %
HCT: 39.7 % (ref 39.0–52.0)
Hemoglobin: 13.1 g/dL (ref 13.0–17.0)
Immature Granulocytes: 1 %
Lymphocytes Relative: 34 %
Lymphs Abs: 2.4 10*3/uL (ref 0.7–4.0)
MCH: 30.9 pg (ref 26.0–34.0)
MCHC: 33 g/dL (ref 30.0–36.0)
MCV: 93.6 fL (ref 80.0–100.0)
Monocytes Absolute: 0.9 10*3/uL (ref 0.1–1.0)
Monocytes Relative: 12 %
Neutro Abs: 3.6 10*3/uL (ref 1.7–7.7)
Neutrophils Relative %: 51 %
Platelets: 253 10*3/uL (ref 150–400)
RBC: 4.24 MIL/uL (ref 4.22–5.81)
RDW: 13.1 % (ref 11.5–15.5)
WBC: 7.2 10*3/uL (ref 4.0–10.5)
nRBC: 0 % (ref 0.0–0.2)

## 2021-02-02 LAB — URINALYSIS, ROUTINE W REFLEX MICROSCOPIC
Bilirubin Urine: NEGATIVE
Glucose, UA: NEGATIVE mg/dL
Hgb urine dipstick: NEGATIVE
Ketones, ur: NEGATIVE mg/dL
Leukocytes,Ua: NEGATIVE
Nitrite: NEGATIVE
Protein, ur: NEGATIVE mg/dL
Specific Gravity, Urine: 1.02 (ref 1.005–1.030)
pH: 6.5 (ref 5.0–8.0)

## 2021-02-02 LAB — TROPONIN I (HIGH SENSITIVITY)
Troponin I (High Sensitivity): 16 ng/L (ref <18)
Troponin I (High Sensitivity): 21 ng/L — ABNORMAL HIGH (ref <18)
Troponin I (High Sensitivity): 26 ng/L — ABNORMAL HIGH (ref <18)
Troponin I (High Sensitivity): 5 ng/L (ref <18)

## 2021-02-02 LAB — ETHANOL: Alcohol, Ethyl (B): <10 mg/dL (ref <10)

## 2021-02-02 LAB — SALICYLATE LEVEL: Salicylate Lvl: <7 mg/dL — ABNORMAL LOW (ref 7.0–30.0)

## 2021-02-02 LAB — ACETAMINOPHEN LEVEL: Acetaminophen (Tylenol), Serum: <10 ug/mL — ABNORMAL LOW (ref 10–30)

## 2021-02-02 LAB — D-DIMER, QUANTITATIVE: D-Dimer, Quant: 0.59 ug/mL-FEU — ABNORMAL HIGH (ref 0.00–0.50)

## 2021-02-02 MED ORDER — IOHEXOL 350 MG/ML SOLN
80.0000 mL | Freq: Once | INTRAVENOUS | Status: AC | PRN
  Administered 2021-02-02: 80 mL via INTRAVENOUS

## 2021-02-02 NOTE — ED Notes (Signed)
Pt ambulated over 34ft w/o assist. Pt stated that he felt good and was steady on feet.  Upon return to room pt stated that he need to use the urinal.

## 2021-02-02 NOTE — ED Notes (Signed)
Pt ambulated ~100 ft to/from bed. Gait steady.

## 2021-02-02 NOTE — ED Provider Notes (Signed)
    Patient seen after prior EDP.  Patient is now ambulating without difficulty.  He is alert and oriented.  He does understand that continued use of illicit substances could result in permanent injury and/or death.  Patient without evidence of significant acute pathology on ED evaluation today.  He appears to be appropriate for outpatient management.  Importance of close follow-up is stressed.  Strict return precautions given and understood.    Wynetta Fines, MD 02/02/21 [phone]

## 2021-02-02 NOTE — Discharge Instructions (Signed)
Stop using fentanyl, heroin and other illicit drugs. Followup with your doctor. Return to the ED if you develop new or worsening symptoms.

## 2021-05-03 ENCOUNTER — Emergency Department (HOSPITAL_COMMUNITY)
Admission: EM | Admit: 2021-05-03 | Discharge: 2021-05-03 | Discharge status: Against Medical Advice | Payer: Medicaid Other

## 2021-05-03 ENCOUNTER — Other Ambulatory Visit: Payer: Self-pay

## 2021-05-03 NOTE — ED Notes (Signed)
Pt called 3x for triage w/o response.

## 2021-05-04 ENCOUNTER — Emergency Department (HOSPITAL_COMMUNITY)
Admission: EM | Admit: 2021-05-04 | Discharge: 2021-05-04 | Disposition: A | Discharge status: Home / Self Care | Payer: Self-pay | Attending: Emergency Medicine | Admitting: Emergency Medicine

## 2021-05-04 ENCOUNTER — Other Ambulatory Visit: Payer: Self-pay

## 2021-05-04 ENCOUNTER — Encounter (HOSPITAL_COMMUNITY): Payer: Self-pay | Admitting: Emergency Medicine

## 2021-05-04 ENCOUNTER — Emergency Department (HOSPITAL_COMMUNITY): Payer: Self-pay

## 2021-05-04 DIAGNOSIS — R0789 Other chest pain: Secondary | ICD-10-CM | POA: Insufficient documentation

## 2021-05-04 DIAGNOSIS — M546 Pain in thoracic spine: Secondary | ICD-10-CM | POA: Insufficient documentation

## 2021-05-04 DIAGNOSIS — F17210 Nicotine dependence, cigarettes, uncomplicated: Secondary | ICD-10-CM | POA: Insufficient documentation

## 2021-05-04 LAB — CBC WITH DIFFERENTIAL/PLATELET
Abs Immature Granulocytes: 0.02 10*3/uL (ref 0.00–0.07)
Basophils Absolute: 0 10*3/uL (ref 0.0–0.1)
Basophils Relative: 0 %
Eosinophils Absolute: 0.1 10*3/uL (ref 0.0–0.5)
Eosinophils Relative: 1 %
HCT: 43.8 % (ref 39.0–52.0)
Hemoglobin: 14.5 g/dL (ref 13.0–17.0)
Immature Granulocytes: 0 %
Lymphocytes Relative: 29 %
Lymphs Abs: 2.3 10*3/uL (ref 0.7–4.0)
MCH: 30.9 pg (ref 26.0–34.0)
MCHC: 33.1 g/dL (ref 30.0–36.0)
MCV: 93.2 fL (ref 80.0–100.0)
Monocytes Absolute: 0.9 10*3/uL (ref 0.1–1.0)
Monocytes Relative: 11 %
Neutro Abs: 4.7 10*3/uL (ref 1.7–7.7)
Neutrophils Relative %: 59 %
Platelets: 250 10*3/uL (ref 150–400)
RBC: 4.7 MIL/uL (ref 4.22–5.81)
RDW: 13.3 % (ref 11.5–15.5)
WBC: 8 10*3/uL (ref 4.0–10.5)
nRBC: 0 % (ref 0.0–0.2)

## 2021-05-04 LAB — COMPREHENSIVE METABOLIC PANEL
ALT: 231 U/L — ABNORMAL HIGH (ref 0–44)
AST: 110 U/L — ABNORMAL HIGH (ref 15–41)
Albumin: 4 g/dL (ref 3.5–5.0)
Alkaline Phosphatase: 54 U/L (ref 38–126)
Anion gap: 8 (ref 5–15)
BUN: 12 mg/dL (ref 6–20)
CO2: 26 mmol/L (ref 22–32)
Calcium: 9.1 mg/dL (ref 8.9–10.3)
Chloride: 101 mmol/L (ref 98–111)
Creatinine, Ser: 0.91 mg/dL (ref 0.61–1.24)
GFR, Estimated: >60 mL/min (ref >60)
Glucose, Bld: 114 mg/dL — ABNORMAL HIGH (ref 70–99)
Potassium: 3.5 mmol/L (ref 3.5–5.1)
Sodium: 135 mmol/L (ref 135–145)
Total Bilirubin: 0.6 mg/dL (ref 0.3–1.2)
Total Protein: 7.8 g/dL (ref 6.5–8.1)

## 2021-05-04 LAB — TROPONIN I (HIGH SENSITIVITY)
Troponin I (High Sensitivity): 3 ng/L (ref <18)
Troponin I (High Sensitivity): 4 ng/L (ref <18)

## 2021-05-04 MED ORDER — KETOROLAC TROMETHAMINE 30 MG/ML IJ SOLN
30.0000 mg | Freq: Once | INTRAMUSCULAR | Status: AC
  Administered 2021-05-04: 30 mg via INTRAMUSCULAR
  Filled 2021-05-04: qty 1

## 2021-05-04 NOTE — ED Provider Notes (Signed)
Emergency Medicine Provider Triage Evaluation Note  Ronald Greene , a 25 y.o. male  was evaluated in triage.  Pt complains of chest pain since yesterday, left chest, constant, worse with certain movements/position changes and with breathing sometimes. Did have 2 episodes of emesis @ onset. Denies leg pain/swelling, hemoptysis, recent surgery/trauma, recent long travel, hormone use, personal hx of cancer, or hx of DVT/PE.   Review of Systems  Per HPI  Physical Exam  BP (!) 143/99   Pulse 100   Temp 98.6 F (37 C)   Resp 16   Ht 6' (1.829 m)   Wt 102 kg   SpO2 100%   BMI 30.50 kg/m  Gen:   Awake, no distress   Resp:  Normal effort  MSK:   Moves extremities without difficulty    Medical Decision Making  Medically screening exam initiated at 5:03 AM.  Appropriate orders placed.  Ronald Greene was informed that the remainder of the evaluation will be completed by another provider, this initial triage assessment does not replace that evaluation, and the importance of remaining in the ED until their evaluation is complete.  Chest pain.    Ronald Greene 05/04/21 0504    Glynn Octave, MD 05/04/21 [phone]

## 2021-05-04 NOTE — Discharge Instructions (Signed)
You have been seen and discharged from the emergency department.  Your cardiac work-up was normal.  I believe your pain is most likely musculoskeletal/chest wall. Treat with Tylenol and Ibuprofen. Follow-up with your primary provider for reevaluation and further care. Take home medications as prescribed. If you have any worsening symptoms or further concerns for your health please return to an emergency department for further evaluation.

## 2021-05-04 NOTE — ED Notes (Signed)
Reviewed discharge instructions with patient. Follow-up care and pain management reviewed. Patient verbalized understanding. Patient A&Ox4, VSS, and ambulatory with steady gait upon discharge. 

## 2021-05-04 NOTE — ED Notes (Signed)
Pt ambulatory to room w/ NAD and cheetos in hand.
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
## 2021-05-04 NOTE — ED Triage Notes (Signed)
Patient reports left chest pain worse with deep inspiration and movement/changing positions onset last week , emesis x2 , mild SOB , no cough or fever .

## 2021-07-12 ENCOUNTER — Other Ambulatory Visit: Payer: Self-pay

## 2021-07-12 ENCOUNTER — Ambulatory Visit (HOSPITAL_COMMUNITY)
Admission: EM | Admit: 2021-07-12 | Discharge: 2021-07-12 | Disposition: A | Discharge status: Home / Self Care | Payer: Self-pay | Attending: Emergency Medicine | Admitting: Emergency Medicine

## 2021-07-12 ENCOUNTER — Encounter (HOSPITAL_COMMUNITY): Payer: Self-pay | Admitting: Emergency Medicine

## 2021-07-12 DIAGNOSIS — R369 Urethral discharge, unspecified: Secondary | ICD-10-CM | POA: Insufficient documentation

## 2021-07-12 HISTORY — DX: Syphilis, unspecified: A53.9

## 2021-07-12 LAB — HIV ANTIBODY (ROUTINE TESTING W REFLEX): HIV Screen 4th Generation wRfx: NONREACTIVE

## 2021-07-12 MED ORDER — DOXYCYCLINE HYCLATE 100 MG PO CAPS: 100.0000 mg | ORAL_CAPSULE | Freq: Two times a day (BID) | ORAL | 0 refills | Status: AC

## 2021-07-12 NOTE — Discharge Instructions (Signed)
Based on the history that you provided to me today, you were treated empirically for chlamydia with a prescription for doxycycline, 1 tablet twice daily for the next 7 days.  Please take all tablets as prescribed, do not skip doses.  Failure to take all doses as prescribed can result in a worsening infection that will be more difficult to treat and resolve.  Please abstain from sexual intercourse for 7 days. ?  ?The results of your STD testing today will be made available to you once they are complete, this typically takes 3 to 5 days.  They will initially be posted to your MyChart and, if any of your results are abnormal, you will receive a phone call with those results along with further instructions regarding any further treatment, if needed.  ?  ?Please remember that the only way to prevent transmission of sexually transmitted disease when having sexual intercourse is to use condoms.  Repeat sexually transmitted infections can cause scarring of the tubes that carry sperm from your testicles to your penis during ejaculation.  This can interfere with your your ability to have children.  Repeat exposures to sexually transmitted diseases can also increase your risk of human papilloma virus which causes genital warts. ?  ?If you have not had complete resolution of your symptoms after completing treatment, please return for repeat evaluation. ?  ?Thank you for visiting urgent care today.  I appreciate the opportunity to participate in your care.  ?

## 2021-07-12 NOTE — ED Triage Notes (Signed)
Pt reports yellowish penile discharge
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
## 2021-07-13 LAB — CYTOLOGY, (ORAL, ANAL, URETHRAL) ANCILLARY ONLY
Chlamydia: NEGATIVE
Comment: NEGATIVE
Comment: NEGATIVE
Comment: NORMAL
Neisseria Gonorrhea: NEGATIVE
Trichomonas: NEGATIVE

## 2021-07-13 LAB — RPR: RPR Ser Ql: NONREACTIVE

## 2021-09-02 ENCOUNTER — Other Ambulatory Visit: Payer: Self-pay

## 2021-09-02 DIAGNOSIS — R300 Dysuria: Secondary | ICD-10-CM | POA: Insufficient documentation

## 2021-09-02 DIAGNOSIS — Z202 Contact with and (suspected) exposure to infections with a predominantly sexual mode of transmission: Secondary | ICD-10-CM | POA: Insufficient documentation

## 2021-09-03 ENCOUNTER — Emergency Department (HOSPITAL_COMMUNITY)
Admission: EM | Admit: 2021-09-03 | Discharge: 2021-09-03 | Disposition: A | Discharge status: Home / Self Care | Payer: Self-pay | Attending: Emergency Medicine | Admitting: Emergency Medicine

## 2021-09-03 ENCOUNTER — Other Ambulatory Visit: Payer: Self-pay

## 2021-09-03 ENCOUNTER — Encounter (HOSPITAL_COMMUNITY): Payer: Self-pay

## 2021-09-03 DIAGNOSIS — Z202 Contact with and (suspected) exposure to infections with a predominantly sexual mode of transmission: Secondary | ICD-10-CM

## 2021-09-03 LAB — URINALYSIS, DIPSTICK ONLY
Bilirubin Urine: NEGATIVE
Glucose, UA: NEGATIVE mg/dL
Hgb urine dipstick: NEGATIVE
Ketones, ur: 20 mg/dL — AB
Nitrite: NEGATIVE
Protein, ur: 100 mg/dL — AB
Specific Gravity, Urine: 1.034 — ABNORMAL HIGH (ref 1.005–1.030)
pH: 5 (ref 5.0–8.0)

## 2021-09-03 MED ORDER — CEFTRIAXONE SODIUM 500 MG IJ SOLR
250.0000 mg | Freq: Once | INTRAMUSCULAR | Status: AC
  Administered 2021-09-03: 250 mg via INTRAMUSCULAR
  Filled 2021-09-03: qty 500

## 2021-09-03 MED ORDER — AZITHROMYCIN 250 MG PO TABS
1000.0000 mg | ORAL_TABLET | Freq: Every day | ORAL | Status: DC
  Administered 2021-09-03: 1000 mg via ORAL
  Filled 2021-09-03: qty 4

## 2021-09-03 NOTE — ED Provider Triage Note (Signed)
?  Emergency Medicine Provider Triage Evaluation Note ? ?MRN:  [id number]  ?Arrival date & time: 09/03/21    ?Medically screening exam initiated at 12:42 AM.   ?CC:   ?Exposure to STD ?  ?HPI:  ?Ronald Greene is a 26 y.o. year-old male presents to the ED with chief complaint of penile discharge.  Also curious about his recent syphilis results from urgent care.  Denies fevers. ? ?History provided by History provided by patient. ?ROS:  ?-As included in HPI ?PE:  ? ?Vitals:  ? 09/02/21 2342 09/03/21 0035  ?BP: 130/88   ?Pulse: 92   ?Resp: 16   ?Temp: 97.9 ?F (36.6 ?C)   ?SpO2: 98% 98%  ?  ?Non-toxic appearing ?No respiratory distress ? ?MDM:  ?Based on signs and symptoms, gonorrhea or chlamydia is highest on my differential. ?I've ordered UA and GC in triage to expedite lab/diagnostic workup. ? ?Results from [facility] 2 months ago are negative for syphilis. ? ?Patient was informed that the remainder of the evaluation will be completed by another provider, this initial triage assessment does not replace that evaluation, and the importance of remaining in the ED until their evaluation is complete. ? ?  ?Roxy Horseman, PA-C ?09/03/21 0045 ? ?

## 2021-09-03 NOTE — ED Triage Notes (Signed)
Complaining of yellow discharge for 2 days, feels like there is a lump on the penis as well. ?He also has been having coughing and feeling bad today. ?

## 2021-09-03 NOTE — ED Provider Notes (Signed)
?[facility] EMERGENCY [facility]PARTMENT ?Provider Note ? ? ?CSN: [id number] ?Arrival date & time: 09/02/21  2332 ? ?  ? ?History ? ?Chief Complaint  ?Patient presents with  ? Exposure to STD  ? ? ?Ronald Greene is a 26 y.o. male. ? ?The history is provided by the patient and medical records.  ?Exposure to STD ? ?26 year old male presenting to the ED with penile discharge that began 2 days ago.  States somewhat [place] in color.  Intermittent dysuria but denies any hematuria.  He denies any testicular pain, swelling, or genital lesions.  No rashes.  Did have syphilis testing in February but was never notified of the results so inquiring about that as well.  Denies any new sexual partner. ? ?Home Medications ?Prior to Admission medications   ?Not on File  ?   ? ?Allergies    ?Aloe   ? ?Review of Systems   ?Review of Systems  ?Genitourinary:  Positive for dysuria and penile discharge.  ?All other systems reviewed and are negative. ? ?Physical Exam ?Updated Vital Signs ?BP 130/88 (BP Location: Left Arm)   Pulse 92   Temp 97.9 ?F (36.6 ?C) (Oral)   Resp 16   Ht 5\' 11"  (1.803 m)   Wt 90.7 kg   SpO2 98%   BMI 27.89 kg/m?  ? ?Physical Exam ?Vitals and nursing note reviewed.  ?Constitutional:   ?   Appearance: He is well-developed.  ?HENT:  ?   Head: Normocephalic and atraumatic.  ?Eyes:  ?   Conjunctiva/sclera: Conjunctivae normal.  ?   Pupils: Pupils are equal, round, and reactive to light.  ?Cardiovascular:  ?   Rate and Rhythm: Normal rate and regular rhythm.  ?   Heart sounds: Normal heart sounds.  ?Pulmonary:  ?   Effort: Pulmonary effort is normal.  ?   Breath sounds: Normal breath sounds.  ?Abdominal:  ?   General: Bowel sounds are normal.  ?   Palpations: Abdomen is soft.  ?Genitourinary: ?   Comments: deferred ?Musculoskeletal:     ?   General: Normal range of motion.  ?   Cervical back: Normal range of motion.  ?Skin: ?   General: Skin is warm and dry.  ?Neurological:  ?   Mental Status: He is  alert and oriented to person, place, and time.  ? ? ?ED Results / Procedures / Treatments   ?Labs ?(all labs ordered are listed, but only abnormal results are displayed) ?Labs Reviewed  ?URINALYSIS, DIPSTICK ONLY - Abnormal; Notable for the following components:  ?    Result Value  ? APPearance HAZY (*)   ? Specific Gravity, Urine 1.034 (*)   ? Ketones, ur 20 (*)   ? Protein, ur 100 (*)   ? Leukocytes,Ua LARGE (*)   ? All other components within normal limits  ?GC/CHLAMYDIA PROBE AMP ([place]) NOT AT [facility]  ? ? ?EKG ?None ? ?Radiology ?No results found. ? ?Procedures ?Procedures  ? ? ?Medications Ordered in ED ?Medications  ?cefTRIAXone (ROCEPHIN) injection 250 mg (has no administration in time range)  ?azithromycin (ZITHROMAX) tablet 1,000 mg (has no administration in time range)  ? ? ?ED Course/ Medical Decision Making/ A&P ?  ?                        ?Medical Decision Making ?Amount and/or Complexity of Data Reviewed ?External Data Reviewed: labs and notes. ? ?Risk ?Prescription drug management. ? ? ?26 year old male presenting  to the ED with penile discharge and concern for STD.  Afebrile, nontoxic.  Denies any testicular pain or swelling, no genital lesions, no rash.  Recent syphilis and HIV testing from urgent care reviewed with him, results are negative.  UA here with large leuks, gc/chl pending.  Given his symptoms, elected to treat empirically with Rocephin and azithromycin.  Advised he will be notified if culture results are positive.  Can follow-up with health department for future STD concerns.  Can return here for new or acute changes. ? ?Final Clinical Impression(s) / ED Diagnoses ?Final diagnoses:  ?Possible exposure to STD  ? ? ?Rx / DC Orders ?ED Discharge Orders   ? ? None  ? ?  ? ? ?  ?Garlon Hatchet, PA-C ?09/03/21 0401 ? ?  ?Sabas Sous, MD ?09/03/21 [phone] ? ?

## 2021-09-03 NOTE — Discharge Instructions (Signed)
You will be notified in the next 48-72 hours if your culture results are positive.  If so, you will need to notify partner so they can be tested/treated. ?

## 2021-10-22 ENCOUNTER — Other Ambulatory Visit: Payer: Self-pay

## 2021-10-22 ENCOUNTER — Other Ambulatory Visit (HOSPITAL_COMMUNITY): Payer: Self-pay

## 2021-10-22 ENCOUNTER — Ambulatory Visit (INDEPENDENT_AMBULATORY_CARE_PROVIDER_SITE_OTHER): Payer: Self-pay | Admitting: Pharmacist

## 2021-10-22 DIAGNOSIS — Z79899 Other long term (current) drug therapy: Secondary | ICD-10-CM
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
## 2021-10-23 ENCOUNTER — Other Ambulatory Visit (HOSPITAL_COMMUNITY): Payer: Self-pay

## 2021-10-23 ENCOUNTER — Encounter: Payer: Self-pay | Admitting: Pharmacist

## 2021-10-23 ENCOUNTER — Telehealth (HOSPITAL_COMMUNITY): Payer: Self-pay | Admitting: Pharmacy Technician

## 2021-10-23 ENCOUNTER — Other Ambulatory Visit: Payer: Self-pay | Admitting: Pharmacist

## 2021-10-23 DIAGNOSIS — Z79899 Other long term (current) drug therapy: Secondary | ICD-10-CM

## 2021-10-23 LAB — BASIC METABOLIC PANEL
BUN: 11 mg/dL (ref 7–25)
CO2: 24 mmol/L (ref 20–32)
Calcium: 10.4 mg/dL — ABNORMAL HIGH (ref 8.6–10.3)
Chloride: 103 mmol/L (ref 98–110)
Creat: 1.02 mg/dL (ref 0.60–1.24)
Glucose, Bld: 108 mg/dL — ABNORMAL HIGH (ref 65–99)
Potassium: 4 mmol/L (ref 3.5–5.3)
Sodium: 140 mmol/L (ref 135–146)

## 2021-10-23 LAB — LIPID PANEL
Cholesterol: 213 mg/dL — ABNORMAL HIGH (ref <200)
HDL: 72 mg/dL (ref >=40)
LDL Cholesterol (Calc): 120 mg/dL (calc) — ABNORMAL HIGH
Non-HDL Cholesterol (Calc): 141 mg/dL (calc) — ABNORMAL HIGH (ref <130)
Total CHOL/HDL Ratio: 3 (calc) (ref <5.0)
Triglycerides: 106 mg/dL (ref <150)

## 2021-10-23 LAB — RPR: RPR Ser Ql: NONREACTIVE

## 2021-10-23 LAB — HIV ANTIBODY (ROUTINE TESTING W REFLEX): HIV 1&2 Ab, 4th Generation: NONREACTIVE

## 2021-10-23 MED ORDER — DESCOVY 200-25 MG PO TABS
1.0000 | ORAL_TABLET | Freq: Every day | ORAL | 2 refills | Status: AC
  Filled 2021-10-23 – 2021-10-26 (×2): qty 30, 30d supply, fill #0

## 2021-10-23 NOTE — Telephone Encounter (Signed)
Patient Advocate Encounter  Completed and sent [employer] Advancing Access application for Descovy 200-25 mg for this patient who is uninsured.      BIN      [id number] PCN    [id number] [id number]    [id number] ID        [id number]    [name], [place] Patient Advocate Specialist [place] Patient Advocate Team Direct Number: [phone]  Fax: [phone]

## 2021-10-26 ENCOUNTER — Other Ambulatory Visit (HOSPITAL_COMMUNITY): Payer: Self-pay

## 2021-10-26 LAB — URINE CYTOLOGY ANCILLARY ONLY
Chlamydia: NEGATIVE
Comment: NEGATIVE
Comment: NORMAL
Neisseria Gonorrhea: NEGATIVE

## 2021-11-16 ENCOUNTER — Other Ambulatory Visit (HOSPITAL_COMMUNITY): Payer: Self-pay

## 2021-11-18 ENCOUNTER — Other Ambulatory Visit (HOSPITAL_COMMUNITY): Payer: Self-pay

## 2021-11-20 ENCOUNTER — Other Ambulatory Visit (HOSPITAL_COMMUNITY): Payer: Self-pay

## 2021-11-27 ENCOUNTER — Other Ambulatory Visit (HOSPITAL_COMMUNITY): Payer: Self-pay

## 2021-11-27 ENCOUNTER — Telehealth: Payer: Self-pay

## 2021-11-27 NOTE — Telephone Encounter (Signed)
[facility] Patient Advocate Encounter  [facility] and I have been unsuccsessful in reaching patient to be able to refill medication.    We have tried multiple times without a response.  (Need new contact #)  Clearance Coots, CPhT Specialty Pharmacy Patient [facility] Phone: [phone] Fax:  [phone]

## 2022-01-26 ENCOUNTER — Ambulatory Visit: Payer: Self-pay | Admitting: Pharmacist

## 2022-02-15 ENCOUNTER — Emergency Department (HOSPITAL_COMMUNITY)
Admission: EM | Admit: 2022-02-15 | Discharge: 2022-02-16 | Disposition: A | Discharge status: Home / Self Care | Payer: Medicaid Other | Attending: Emergency Medicine | Admitting: Emergency Medicine

## 2022-02-15 ENCOUNTER — Ambulatory Visit (HOSPITAL_COMMUNITY)
Admission: RE | Admit: 2022-02-15 | Discharge: 2022-02-15 | Disposition: A | Discharge status: Home / Self Care | Payer: Self-pay | Attending: Psychiatry | Admitting: Psychiatry

## 2022-02-15 ENCOUNTER — Other Ambulatory Visit: Payer: Self-pay

## 2022-02-15 ENCOUNTER — Encounter (HOSPITAL_COMMUNITY): Payer: Self-pay

## 2022-02-15 DIAGNOSIS — Y900 Blood alcohol level of less than 20 mg/100 ml: Secondary | ICD-10-CM | POA: Insufficient documentation

## 2022-02-15 DIAGNOSIS — R7401 Elevation of levels of liver transaminase levels: Secondary | ICD-10-CM | POA: Insufficient documentation

## 2022-02-15 DIAGNOSIS — Z139 Encounter for screening, unspecified: Secondary | ICD-10-CM

## 2022-02-15 DIAGNOSIS — F1910 Other psychoactive substance abuse, uncomplicated: Secondary | ICD-10-CM

## 2022-02-15 DIAGNOSIS — Z0000 Encounter for general adult medical examination without abnormal findings: Secondary | ICD-10-CM | POA: Insufficient documentation

## 2022-02-15 DIAGNOSIS — Z20822 Contact with and (suspected) exposure to covid-19: Secondary | ICD-10-CM | POA: Insufficient documentation

## 2022-02-15 MED ORDER — ONDANSETRON 8 MG PO TBDP: 8.0000 mg | ORAL_TABLET | Freq: Once | ORAL | Status: DC

## 2022-02-15 NOTE — Progress Notes (Signed)
Engineer, manufacturing systems in lobby waiting on disposition. Informed officer NP is sending pt to [facility] ED for medical clearance given his medical presentation, however, NP does not believe pt needs inpatient psych treatment once medically cleared. Officer states she fears if pt returns home he will over dose and die, either by accident or on purpose. Officer states pt has attempted suicide twice in the past. Officer requesting psych re-evaluation for detox with transfer to [facility] after detox. Officer feels pt is a danger to himself. Per NP patient denying any suicidal ideation.  Clayborne Dana

## 2022-02-15 NOTE — ED Triage Notes (Signed)
Pt is here for a medical clearance so that he can go to [facility]. Pt reports using multiple drugs daily including cocaine, meth, heroin.

## 2022-02-15 NOTE — ED Provider Triage Note (Signed)
Emergency Medicine Provider Triage Evaluation Note  Ronald Greene , a 26 y.o. male  was evaluated in triage.  Pt complains of vomiting, denies diarrhea, history of substance abuse. Approved to go to [facility] but needs medical clearance. Denies SI, HI. No etoh, last used drugs today.  Review of Systems  Positive: As above Negative: As above  Physical Exam  There were no vitals taken for this visit. Gen:   Awake, no distress   Resp:  Normal effort  MSK:   Moves extremities without difficulty  Other:    Medical Decision Making  Medically screening exam initiated at 9:18 PM.  Appropriate orders placed.  Ronald Greene was informed that the remainder of the evaluation will be completed by another provider, this initial triage assessment does not replace that evaluation, and the importance of remaining in the ED until their evaluation is complete.     Tacy Learn, PA-C 02/15/22 2119
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
## 2022-02-16 LAB — CBC WITH DIFFERENTIAL/PLATELET
Abs Immature Granulocytes: 0.03 10*3/uL (ref 0.00–0.07)
Basophils Absolute: 0 10*3/uL (ref 0.0–0.1)
Basophils Relative: 0 %
Eosinophils Absolute: 0.1 10*3/uL (ref 0.0–0.5)
Eosinophils Relative: 1 %
HCT: 47.1 % (ref 39.0–52.0)
Hemoglobin: 15.4 g/dL (ref 13.0–17.0)
Immature Granulocytes: 0 %
Lymphocytes Relative: 33 %
Lymphs Abs: 3.4 10*3/uL (ref 0.7–4.0)
MCH: 31.2 pg (ref 26.0–34.0)
MCHC: 32.7 g/dL (ref 30.0–36.0)
MCV: 95.3 fL (ref 80.0–100.0)
Monocytes Absolute: 1.2 10*3/uL — ABNORMAL HIGH (ref 0.1–1.0)
Monocytes Relative: 12 %
Neutro Abs: 5.4 10*3/uL (ref 1.7–7.7)
Neutrophils Relative %: 54 %
Platelets: 301 10*3/uL (ref 150–400)
RBC: 4.94 MIL/uL (ref 4.22–5.81)
RDW: 13.1 % (ref 11.5–15.5)
WBC: 10.2 10*3/uL (ref 4.0–10.5)
nRBC: 0 % (ref 0.0–0.2)

## 2022-02-16 LAB — COMPREHENSIVE METABOLIC PANEL
ALT: 158 U/L — ABNORMAL HIGH (ref 0–44)
AST: 88 U/L — ABNORMAL HIGH (ref 15–41)
Albumin: 3.8 g/dL (ref 3.5–5.0)
Alkaline Phosphatase: 43 U/L (ref 38–126)
Anion gap: 7 (ref 5–15)
BUN: 11 mg/dL (ref 6–20)
CO2: 28 mmol/L (ref 22–32)
Calcium: 9.4 mg/dL (ref 8.9–10.3)
Chloride: 102 mmol/L (ref 98–111)
Creatinine, Ser: 0.84 mg/dL (ref 0.61–1.24)
GFR, Estimated: >60 mL/min (ref >60)
Glucose, Bld: 114 mg/dL — ABNORMAL HIGH (ref 70–99)
Potassium: 3.4 mmol/L — ABNORMAL LOW (ref 3.5–5.1)
Sodium: 137 mmol/L (ref 135–145)
Total Bilirubin: 0.4 mg/dL (ref 0.3–1.2)
Total Protein: 7.7 g/dL (ref 6.5–8.1)

## 2022-02-16 LAB — RAPID URINE DRUG SCREEN, HOSP PERFORMED
Amphetamines: NOT DETECTED
Barbiturates: NOT DETECTED
Benzodiazepines: NOT DETECTED
Cocaine: POSITIVE — AB
Opiates: NOT DETECTED
Tetrahydrocannabinol: NOT DETECTED

## 2022-02-16 LAB — ETHANOL: Alcohol, Ethyl (B): <10 mg/dL (ref <10)

## 2022-02-16 LAB — RESP PANEL BY RT-PCR (FLU A&B, COVID) ARPGX2
Influenza A by PCR: NEGATIVE
Influenza B by PCR: NEGATIVE
SARS Coronavirus 2 by RT PCR: NEGATIVE

## 2022-02-16 NOTE — ED Provider Notes (Signed)
[facility] DEPT Provider Note   CSN: [id number] Arrival date & time: 02/15/22  1949     History  Chief Complaint  Patient presents with   Medical Clearance    Auryn Fauble is a 26 y.o. male.  The history is provided by the patient and medical records.   26 year old male with history of polysubstance abuse, presenting to the ED for medical clearance lab to enter treatment at [facility].  States he has already been accepted but needed clearance labs prior to entering.  He admits to using cocaine, meth, and heroin, last use was yesterday.  Does occasionally drink alcohol but not to excess.  Denies any SI/HI.    Home Medications Prior to Admission medications   Medication Sig Start Date End Date Taking? Authorizing Provider  emtricitabine-tenofovir AF (DESCOVY) 200-25 MG tablet Take 1 tablet by mouth daily. 10/23/21   Esmond Plants, RPH-CPP      Allergies    Aloe    Review of Systems   Review of Systems  Psychiatric/Behavioral:         Medical clearance for detox  All other systems reviewed and are negative.   Physical Exam Updated Vital Signs BP 128/88   Pulse 90   Temp 98 F (36.7 C)   Resp 18   Ht 5\' 11"  (1.803 m)   Wt 90.7 kg   SpO2 100%   BMI 27.89 kg/m   Physical Exam Vitals and nursing note reviewed.  Constitutional:      Appearance: He is well-developed.     Comments: Sleeping, awoken for exam  HENT:     Head: Normocephalic and atraumatic.  Eyes:     Conjunctiva/sclera: Conjunctivae normal.     Pupils: Pupils are equal, round, and reactive to light.  Cardiovascular:     Rate and Rhythm: Normal rate and regular rhythm.     Heart sounds: Normal heart sounds.  Pulmonary:     Effort: No respiratory distress.     Breath sounds: Normal breath sounds. No rhonchi.  Abdominal:     General: Bowel sounds are normal.     Palpations: Abdomen is soft.     Tenderness: There is no abdominal tenderness. There is no rebound.   Musculoskeletal:        General: Normal range of motion.     Cervical back: Normal range of motion.  Skin:    General: Skin is warm and dry.  Neurological:     Mental Status: He is alert and oriented to person, place, and time.  Psychiatric:     Comments: Denies SI/HI     ED Results / Procedures / Treatments   Labs (all labs ordered are listed, but only abnormal results are displayed) Labs Reviewed  RAPID URINE DRUG SCREEN, HOSP PERFORMED - Abnormal; Notable for the following components:      Result Value   Cocaine POSITIVE (*)    All other components within normal limits  CBC WITH DIFFERENTIAL/PLATELET - Abnormal; Notable for the following components:   Monocytes Absolute 1.2 (*)    All other components within normal limits  COMPREHENSIVE METABOLIC PANEL - Abnormal; Notable for the following components:   Potassium 3.4 (*)    Glucose, Bld 114 (*)    AST 88 (*)    ALT 158 (*)    All other components within normal limits  RESP PANEL BY RT-PCR (FLU A&B, COVID) ARPGX2  ETHANOL    EKG None  Radiology No results found.  Procedures  Procedures    Medications Ordered in ED Medications  ondansetron (ZOFRAN-ODT) disintegrating tablet 8 mg (8 mg Oral Not Given 02/16/22 0214)    ED Course/ Medical Decision Making/ A&P                           Medical Decision Making    Final Clinical Impression(s) / ED Diagnoses Final diagnoses:  Polysubstance abuse (HCC)  Encounter for medical screening examination   26 y.o. M presenting to the ED requesting detox clearance labs.  He reportedly has a bed held for him at [facility] but needs lab work to enter treatment.  He denies any current withdrawal symptoms.  Last use yesterday evening.  He is sleeping on exam, easily arouses.  Does not appear to be in acute withdrawal.  Stable VS.  Labs reviewed--- LFT's elevated, hx of same but values improved compared with prior.  UDS + for cocaine.  Negative covid/flu screening.  Denies SI/HI.   Stable for discharge.  He was given copies of his labs for review at [facility].  Can return here for new concerns.  Rx / DC Orders ED Discharge Orders     None         Garlon Hatchet, PA-C 02/16/22 0219    Gilda Crease, MD 02/16/22 [phone]

## 2022-02-16 NOTE — Discharge Instructions (Signed)
Labs on back for [facility] to review.

## 2022-03-12 ENCOUNTER — Emergency Department (HOSPITAL_COMMUNITY)
Admission: EM | Admit: 2022-03-12 | Discharge: 2022-03-12 | Discharge status: Against Medical Advice | Payer: Medicaid Other | Attending: Emergency Medicine | Admitting: Emergency Medicine

## 2022-03-12 ENCOUNTER — Encounter (HOSPITAL_COMMUNITY): Payer: Self-pay

## 2022-03-12 ENCOUNTER — Other Ambulatory Visit: Payer: Self-pay

## 2022-03-12 DIAGNOSIS — T63301A Toxic effect of unspecified spider venom, accidental (unintentional), initial encounter: Secondary | ICD-10-CM | POA: Insufficient documentation

## 2022-03-12 DIAGNOSIS — Z5321 Procedure and treatment not carried out due to patient leaving prior to being seen by health care provider: Secondary | ICD-10-CM | POA: Insufficient documentation

## 2022-03-12 NOTE — ED Triage Notes (Signed)
Patient reports that he thinks he was bitten by a spider to the right lateral neck 3 days ago.

## 2022-03-16 ENCOUNTER — Emergency Department (HOSPITAL_COMMUNITY)
Admission: EM | Admit: 2022-03-16 | Discharge: 2022-03-16 | Disposition: A | Discharge status: Home / Self Care | Payer: Medicaid Other | Attending: Emergency Medicine | Admitting: Emergency Medicine

## 2022-03-16 ENCOUNTER — Encounter (HOSPITAL_COMMUNITY): Payer: Self-pay

## 2022-03-16 DIAGNOSIS — L03221 Cellulitis of neck: Secondary | ICD-10-CM | POA: Insufficient documentation

## 2022-03-16 DIAGNOSIS — W57XXXA Bitten or stung by nonvenomous insect and other nonvenomous arthropods, initial encounter: Secondary | ICD-10-CM | POA: Insufficient documentation

## 2022-03-16 DIAGNOSIS — S1096XA Insect bite of unspecified part of neck, initial encounter: Secondary | ICD-10-CM | POA: Insufficient documentation

## 2022-03-16 MED ORDER — DOXYCYCLINE HYCLATE 100 MG PO CAPS: 100.0000 mg | ORAL_CAPSULE | Freq: Two times a day (BID) | ORAL | 0 refills | Status: DC

## 2022-03-16 NOTE — Discharge Instructions (Addendum)
I did end up sending your prescription to the pharmacy because you did have one on file that match the description we discussed earlier.  Please take the entire course of antibiotics that prescribed.  I recommend that you take them on a full stomach.

## 2022-03-16 NOTE — ED Provider Notes (Signed)
[facility]-EMERGENCY DEPT Provider Note   CSN: [id number] Arrival date & time: 03/16/22  2118     History  Chief Complaint  Patient presents with   Insect Bite    Ronald Greene is a 26 y.o. male with past medical history significant for heroin abuse, hepatitis C who presents with concern for insect bite on the neck, reports that this happens over a week ago but seem to be significantly healing at this time.  He denies any other complaints at this time, fever, chills, chest pain, shortness of breath.  She denies any pus or drainage from the affected site.  Denies any feeling of heart palpitations, fever, chills, nausea, vomiting spider bit him.  HPI     Home Medications Prior to Admission medications   Medication Sig Start Date End Date Taking? Authorizing Provider  doxycycline (VIBRAMYCIN) 100 MG capsule Take 1 capsule (100 mg total) by mouth 2 (two) times daily. 03/16/22  Yes Jidenna Figgs H, PA-C  emtricitabine-tenofovir AF (DESCOVY) 200-25 MG tablet Take 1 tablet by mouth daily. 10/23/21   Jennette Kettle, RPH-CPP      Allergies    Aloe    Review of Systems   Review of Systems  Skin:  Positive for wound.  All other systems reviewed and are negative.   Physical Exam Updated Vital Signs BP (!) 144/78   Pulse (!) 115   Temp 98.4 F (36.9 C)   Resp 16   SpO2 98%  Physical Exam Vitals and nursing note reviewed.  Constitutional:      General: He is not in acute distress.    Appearance: Normal appearance.  HENT:     Head: Normocephalic and atraumatic.  Eyes:     General:        Right eye: No discharge.        Left eye: No discharge.  Cardiovascular:     Rate and Rhythm: Regular rhythm. Tachycardia present.  Pulmonary:     Effort: Pulmonary effort is normal. No respiratory distress.  Musculoskeletal:        General: No deformity.  Skin:    General: Skin is warm and dry.     Comments: Patient with slightly tender, but otherwise  appropriately healing wound from questionable bug bite on right lateral side of neck, some questionable surrounding cellulitis  Neurological:     Mental Status: He is alert and oriented to person, place, and time.  Psychiatric:        Mood and Affect: Mood normal.        Behavior: Behavior normal.     ED Results / Procedures / Treatments   Labs (all labs ordered are listed, but only abnormal results are displayed) Labs Reviewed - No data to display  EKG None  Radiology No results found.  Procedures Procedures    Medications Ordered in ED Medications - No data to display  ED Course/ Medical Decision Making/ A&P                           Medical Decision Making  This an overall well-appearing 26 year old male who presents with concern for a right neck wound concern for spider bite several days ago.  Discussed with patient that it is difficult to impossible to say at this time whether or not he was bitten by a spider, however at this time I see a wound with signs of some surrounding cellulitis.  Recommend treatment doxycycline, I  do not see any discrete abscess that requires drainage, no fluctuance but some overlying redness and warmth suggestive of developing cellulitis.  Patient does have a mild tachycardia on arrival, but with regular rhythm on auscultation.  He is not complaining of any heart palpitations, chest pain, denies any recent travel, leg swelling, hemoptysis, local emergency room for any developing PE patient discharged, to be secondary to pain, anxiety as patient is quite anxious on exam.  We will treat for cellulitis, encourage follow-up if no improvement with antibiotics, but discussed that the initial wound/"bug bite" will likely take 1 to 2 weeks to heal. Final Clinical Impression(s) / ED Diagnoses Final diagnoses:  Insect bite of neck, initial encounter  Cellulitis of neck    Rx / DC Orders ED Discharge Orders          Ordered    doxycycline  (VIBRAMYCIN) 100 MG capsule  2 times daily        03/16/22 2313              Kirsten Mckone, [place] H, PA-C 03/16/22 2343    Drenda Freeze, MD 03/17/22 1549

## 2022-03-16 NOTE — ED Triage Notes (Signed)
Pt reports being bitten by an insect on neck, he believes a spider, but is unsure. Pt states that he also has a bite on his leg. Pt reports smaller bumps surfacing around the bite on neck.

## 2022-03-31 ENCOUNTER — Other Ambulatory Visit (HOSPITAL_COMMUNITY): Payer: Self-pay

## 2022-06-11 ENCOUNTER — Ambulatory Visit (HOSPITAL_COMMUNITY)
Admission: EM | Admit: 2022-06-11 | Discharge: 2022-06-11 | Disposition: A | Discharge status: Home / Self Care | Payer: Medicaid Other | Attending: Psychiatry | Admitting: Psychiatry

## 2022-06-11 DIAGNOSIS — F1510 Other stimulant abuse, uncomplicated: Secondary | ICD-10-CM | POA: Insufficient documentation

## 2022-06-11 NOTE — Progress Notes (Signed)
Patient is a 27 year old male who is presents voluntarily at the request of his probation officer. Patient denies SI/HI/AHV. Patient did not report any symtpoms of depression or psychosis. He also denies any SA. Patient was well-groomed, cooperative and friendly.    06/11/22 1215  [place] (Walk-ins at [facility] only)  How Did You Hear About [place]? Legal System  What Is the Reason for Your Visit/Call Today? Patient is a 27 year old male who is presents voluntarily at the request of his Passenger transport manager.  Patient denies SI/HI/AHV.  Patine did not report any symtpoms of depression or psychosis.  He also denies any SA.  Patient was well-groomed, cooperative and friendly.  How Long Has This Been Causing You Problems? <Week  Have You Recently Had Any Thoughts About Hurting Yourself? No  Are You Planning to Commit Suicide/Harm Yourself At This time? No  Have you Recently Had Thoughts About [place]? No  Are You Planning To Harm Someone At This Time? No  Have You Used Any Alcohol or Drugs in the Past 24 Hours? No  Do you have any current medical co-morbidities that require immediate attention? No  Clinician description of patient physical appearance/behavior: Well-groomed,. calm and cooperativr.  What Do You Feel Would Help You the Most Today? Medication(s)  If access to [facility] was not available, would you have sought care in the Emergency Department? No  Determination of Need Routine (7 days)  Options For Referral Other: Comment (patient did not report any mental helath symtpoms)

## 2022-06-11 NOTE — ED Provider Notes (Addendum)
Behavioral Health Urgent Care Medical Screening Exam  Patient Name: Ronald Greene MRN: [id number] Date of Evaluation: 06/11/22 Chief Complaint:   Diagnosis:  Final diagnoses:  Methamphetamine abuse ([place])    History of Present illness: Ronald Greene is a 27 y.o. male.  Presents to [facility] urgent care accompanied with his probation officer Ronald Greene.  They are seeking additional resources for substance abuse.  Patient reports he may be in violation of his probation if he can continues to get "dirty UA" states he does not feel that he is ready for residential treatment at this time however would be willing to go to outpatient substance abuse treatment.  Reports using cocaine, methamphetamine and heroin.  States he last used a couple of days ago.  Patient reports he has limited support system at home and feels too much pressure to try to complete a 28-day program at this time.  He denies suicidal or homicidal ideations.  Denies auditory or visual hallucinations.  During evaluation Ronald Greene is sitting in no acute distress. He is alert/oriented x 4; calm/cooperative; and mood congruent with affect. He is speaking in a clear tone at moderate volume, and normal pace; with good eye contact. His thought process is coherent and relevant; There is no indication that he is currently responding to internal/external stimuli or experiencing delusional thought content; she has denied suicidal/self-harm/homicidal ideation, psychosis, and paranoia. Patient has remained calm throughout assessment and has answered questions appropriately.    Ronald Greene is educated and verbalizes understanding of mental health resources and other crisis services in the community. he is instructed to call 911 and present to the nearest emergency room should he experience any suicidal/homicidal ideation, auditory/visual/hallucinations, or detrimental worsening of his mental health condition.He was a also advised by  Probation officer that he could call the toll-free phone on insurance card to assist with identifying in network counselors and agencies or number on back of Medicaid card to speak with care coordinator.   [place] ED from 06/11/2022 in [facility] ED from 03/16/2022 in [facility] Emergency Department at [facility] ED from 03/12/2022 in [facility] Emergency Department at [place] No Risk No Risk No Risk       Psychiatric Specialty Exam  Presentation  General Appearance:Appropriate for Environment; Casual; Fairly Groomed  Eye Contact:Fair  Speech:Slow; Clear and Coherent  Speech Volume:Decreased  Handedness:Right   Mood and Affect  Mood: Irritable  Affect: Appropriate; Congruent   Thought Process  Thought Processes: Coherent  Descriptions of Associations:Intact  Orientation:Full (Time, Place and Person)  Thought Content:Tangential    Hallucinations:None  Ideas of Reference:None  Suicidal Thoughts:No  Homicidal Thoughts:No   Sensorium  Memory: Recent Fair; Immediate Fair; Remote Fair  Judgment: Fair  Insight: Shallow   Executive Functions  Concentration: Fair  Attention Span: Fair  Recall: [employer] of Knowledge: Fair  Language: Fair   Psychomotor Activity  Psychomotor Activity: Normal   Assets  Assets: Communication Skills; Physical Health   Sleep  Sleep: Good  Number of hours:  11   No data recorded  Physical Exam: Physical Exam Vitals and nursing note reviewed.  Constitutional:      Appearance: Normal appearance.  Cardiovascular:     Rate and Rhythm: Normal rate and regular rhythm.  Pulmonary:     Effort: Pulmonary effort is normal.  Neurological:     Mental Status: He is alert and oriented to person, place, and time.  Psychiatric:  Mood and Affect: Mood normal.        Thought Content: Thought content normal.    Review of Systems   Respiratory: Negative.    Cardiovascular: Negative.   Gastrointestinal:  Positive for nausea.  Psychiatric/Behavioral:  Positive for substance abuse. The patient is nervous/anxious.   All other systems reviewed and are negative.  There were no vitals taken for this visit. There is no height or weight on file to calculate BMI.  Musculoskeletal: Strength & Muscle Tone: within normal limits Gait & Station: normal Patient leans: N/A   [place] [facility] Discharge Disposition for Follow up and Recommendations: Based on my evaluation the patient does not appear to have an emergency medical condition and can be discharged with resources and follow up care in outpatient services for Substance Abuse Intensive Outpatient Program   Derrill [place], NP 06/11/2022, 1:35 PM

## 2022-06-11 NOTE — Discharge Instructions (Signed)

## 2022-07-30 ENCOUNTER — Emergency Department (HOSPITAL_COMMUNITY)
Admission: EM | Admit: 2022-07-30 | Discharge: 2022-07-30 | Disposition: A | Discharge status: Home / Self Care | Payer: Medicaid Other | Attending: Emergency Medicine | Admitting: Emergency Medicine

## 2022-07-30 ENCOUNTER — Encounter (HOSPITAL_COMMUNITY): Payer: Self-pay

## 2022-07-30 DIAGNOSIS — T50901A Poisoning by unspecified drugs, medicaments and biological substances, accidental (unintentional), initial encounter: Secondary | ICD-10-CM

## 2022-07-30 DIAGNOSIS — T401X1A Poisoning by heroin, accidental (unintentional), initial encounter: Secondary | ICD-10-CM | POA: Insufficient documentation

## 2022-07-30 LAB — CBC WITH DIFFERENTIAL/PLATELET
Abs Immature Granulocytes: 0.03 10*3/uL (ref 0.00–0.07)
Basophils Absolute: 0 10*3/uL (ref 0.0–0.1)
Basophils Relative: 0 %
Eosinophils Absolute: 0.1 10*3/uL (ref 0.0–0.5)
Eosinophils Relative: 1 %
HCT: 41 % (ref 39.0–52.0)
Hemoglobin: 13.1 g/dL (ref 13.0–17.0)
Immature Granulocytes: 0 %
Lymphocytes Relative: 16 %
Lymphs Abs: 1.8 10*3/uL (ref 0.7–4.0)
MCH: 30.5 pg (ref 26.0–34.0)
MCHC: 32 g/dL (ref 30.0–36.0)
MCV: 95.6 fL (ref 80.0–100.0)
Monocytes Absolute: 0.9 10*3/uL (ref 0.1–1.0)
Monocytes Relative: 8 %
Neutro Abs: 8.2 10*3/uL — ABNORMAL HIGH (ref 1.7–7.7)
Neutrophils Relative %: 75 %
Platelets: 295 10*3/uL (ref 150–400)
RBC: 4.29 MIL/uL (ref 4.22–5.81)
RDW: 14 % (ref 11.5–15.5)
WBC: 10.9 10*3/uL — ABNORMAL HIGH (ref 4.0–10.5)
nRBC: 0 % (ref 0.0–0.2)

## 2022-07-30 LAB — BASIC METABOLIC PANEL
Anion gap: 10 (ref 5–15)
BUN: 9 mg/dL (ref 6–20)
CO2: 25 mmol/L (ref 22–32)
Calcium: 9.1 mg/dL (ref 8.9–10.3)
Chloride: 103 mmol/L (ref 98–111)
Creatinine, Ser: 0.91 mg/dL (ref 0.61–1.24)
GFR, Estimated: >60 mL/min (ref >60)
Glucose, Bld: 93 mg/dL (ref 70–99)
Potassium: 3.9 mmol/L (ref 3.5–5.1)
Sodium: 138 mmol/L (ref 135–145)

## 2022-07-30 MED ORDER — NALOXONE HCL 4 MG/0.1ML NA LIQD: NASAL | 0 refills | Status: AC

## 2022-07-30 MED ORDER — ONDANSETRON HCL 4 MG/2ML IJ SOLN
4.0000 mg | Freq: Once | INTRAMUSCULAR | Status: AC
  Administered 2022-07-30: 4 mg via INTRAVENOUS
  Filled 2022-07-30: qty 2

## 2022-07-30 MED ORDER — SODIUM CHLORIDE 0.9 % IV BOLUS
1000.0000 mL | Freq: Once | INTRAVENOUS | Status: AC
  Administered 2022-07-30: 1000 mL via INTRAVENOUS

## 2022-07-30 NOTE — ED Provider Notes (Signed)
[facility] Provider Note   CSN: [id number] Arrival date & time: 07/30/22  1856     History  No chief complaint on file.   Youcef Cowfer is a 27 y.o. male.  HPI   27 year old male presents emergency department by EMS after being an assumed drug overdose.  Patient was admittedly injecting heroin in the bathroom.  He states that he accidentally overdosed.  Was given 1 mg of Narcan prior to arrival with good response.  Patient states he has overdosed before.  Currently he is complaining of fatigue and mild nausea.  Denies any other acute illness.  Home Medications Prior to Admission medications   Medication Sig Start Date End Date Taking? Authorizing Provider  doxycycline (VIBRAMYCIN) 100 MG capsule Take 1 capsule (100 mg total) by mouth 2 (two) times daily. 03/16/22   Prosperi, Christian H, PA-C  emtricitabine-tenofovir AF (DESCOVY) 200-25 MG tablet Take 1 tablet by mouth daily. 10/23/21   Esmond Plants, RPH-CPP      Allergies    Aloe    Review of Systems   Review of Systems  Constitutional:  Positive for fatigue.  Respiratory:  Negative for shortness of breath.   Cardiovascular:  Negative for chest pain.  Gastrointestinal:  Positive for nausea. Negative for abdominal pain.  Musculoskeletal:  Negative for back pain.  Neurological:  Negative for headaches.    Physical Exam Updated Vital Signs BP 122/75   Pulse 90   Temp 98.7 F (37.1 C) (Oral)   Resp 18   Ht 6' (1.829 m)   Wt 86.2 kg   SpO2 94%   BMI 25.77 kg/m  Physical Exam Constitutional:      General: He is not in acute distress.    Appearance: He is not diaphoretic.     Comments: Drowsy but alert and oriented to questioning  Eyes:     Comments: Pupils 2-3 mm, equal and reactive b/l  Cardiovascular:     Rate and Rhythm: Normal rate.  Pulmonary:     Effort: Pulmonary effort is normal.  Abdominal:     General: There is no distension.  Neurological:      Mental Status: He is oriented to person, place, and time.     ED Results / Procedures / Treatments   Labs (all labs ordered are listed, but only abnormal results are displayed) Labs Reviewed  CBC WITH DIFFERENTIAL/PLATELET  BASIC METABOLIC PANEL    EKG None  Radiology No results found.  Procedures Procedures    Medications Ordered in ED Medications  sodium chloride 0.9 % bolus 1,000 mL (1,000 mLs Intravenous New Bag/Given 07/30/22 1936)  ondansetron (ZOFRAN) injection 4 mg (4 mg Intravenous Given 07/30/22 1936)    ED Course/ Medical Decision Making/ A&P                             Medical Decision Making Amount and/or Complexity of Data Reviewed Labs: ordered.  Risk Prescription drug management.   27 year old male presents emergency department for overdose.  Was found in the bathroom, was given Narcan prior to arrival.  On my evaluation he is drowsy but alert.  Vital signs are normal.  Respiratory status is normal.  Blood work is unremarkable.  Patient was given IV fluids and medicine.  On reevaluation he has been able to eat and drink without difficulty.  On reevaluation patient is ambulatory, his dad is coming to pick him up.  I educated the patient on the dangers of drug use, overdose.  I offered resources for addiction which she declines.  I will send a prescription for Narcan.  Patient at this time appears safe and stable for discharge and close outpatient follow up. Discharge plan and strict return to ED precautions discussed, patient verbalizes understanding and agreement.        Final Clinical Impression(s) / ED Diagnoses Final diagnoses:  None    Rx / DC Orders ED Discharge Orders     None         Lorelle Gibbs, DO 07/30/22 2220

## 2022-07-30 NOTE — ED Notes (Signed)
Patient was going through the trash cans; was escorted out by [facility] and [facility].

## 2022-07-30 NOTE — ED Triage Notes (Signed)
[place] found in bathroom with syringe beside him; 1 mg narcan given; pt now a and o x 4, endorses heroin use; 96% 4L, HR 90, cbg 104, 120/80;

## 2022-07-30 NOTE — Discharge Instructions (Signed)
You were seen for drug overdose.  You were given rescue medication called Narcan.  Please refrain from illegal drug use.  This can cause you to stop breathing and die.

## 2022-09-04 ENCOUNTER — Emergency Department (HOSPITAL_COMMUNITY): Payer: Medicaid Other

## 2022-09-04 ENCOUNTER — Emergency Department (HOSPITAL_COMMUNITY)
Admission: EM | Admit: 2022-09-04 | Discharge: 2022-09-04 | Disposition: A | Discharge status: Home / Self Care | Payer: Medicaid Other | Attending: Student in an Organized Health Care Education/Training Program | Admitting: Student in an Organized Health Care Education/Training Program

## 2022-09-04 ENCOUNTER — Encounter (HOSPITAL_COMMUNITY): Payer: Self-pay | Admitting: Emergency Medicine

## 2022-09-04 DIAGNOSIS — Y990 Civilian activity done for income or pay: Secondary | ICD-10-CM | POA: Insufficient documentation

## 2022-09-04 DIAGNOSIS — S39012A Strain of muscle, fascia and tendon of lower back, initial encounter: Secondary | ICD-10-CM | POA: Diagnosis not present

## 2022-09-04 DIAGNOSIS — M533 Sacrococcygeal disorders, not elsewhere classified: Secondary | ICD-10-CM

## 2022-09-04 DIAGNOSIS — T148XXA Other injury of unspecified body region, initial encounter: Secondary | ICD-10-CM

## 2022-09-04 DIAGNOSIS — W19XXXA Unspecified fall, initial encounter: Secondary | ICD-10-CM | POA: Insufficient documentation

## 2022-09-04 DIAGNOSIS — M545 Low back pain, unspecified: Secondary | ICD-10-CM | POA: Diagnosis present

## 2022-09-04 MED ORDER — IBUPROFEN 400 MG PO TABS
600.0000 mg | ORAL_TABLET | Freq: Once | ORAL | Status: AC
  Administered 2022-09-04: 600 mg via ORAL
  Filled 2022-09-04: qty 1

## 2022-09-04 NOTE — ED Provider Notes (Signed)
[place] EMERGENCY DEPARTMENT AT [facility] Provider Note   CSN: [id number] Arrival date & time: 09/04/22  4098     History  Chief Complaint  Patient presents with   Back Pain    Ronald Greene is a 27 y.o. male.   Back Pain Associated symptoms: no fever   27 year old male presenting to the emergency department for evaluation of his back pain x 2 days.  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  He fell a short distance onto his back and since then has been reporting some lumbar back pain/coccyx pain.  He states his pain is more severe with standing but resolves when he is sitting.  He gets around using a skateboard and states he has pain when he is pushing himself forward on the skateboard.  He denies any loss of consciousness from the fall.  He also denies any abdominal pain, mid or upper back pain, leg pain, difficulty urinating, saddle anesthesia, or incontinence.  He is able to walk and denies any radiation of his pain down into his legs or weakness of his lower extremities.  He does admit to a past medical history of drug use including IV drugs.  He denies any fevers, history of spinal infections, or pain prior to his fall.  He does relate his pain to the recent fall since it began immediately afterwards.  He has no other complaints at this time.     Home Medications Prior to Admission medications   Medication Sig Start Date End Date Taking? Authorizing Provider  doxycycline (VIBRAMYCIN) 100 MG capsule Take 1 capsule (100 mg total) by mouth 2 (two) times daily. 03/16/22   Prosperi, Christian H, PA-C  emtricitabine-tenofovir AF (DESCOVY) 200-25 MG tablet Take 1 tablet by mouth daily. 10/23/21   Jennette Kettle, RPH-CPP  naloxone [facility]) nasal spray 4 mg/0.1 mL Take as directed 07/30/22   Horton, Clabe Seal, DO      Allergies    Aloe    Review of Systems   Review of Systems  Constitutional:  Negative for chills and fever.   Musculoskeletal:  Positive for back pain.  All other systems reviewed and are negative.   Physical Exam Updated Vital Signs BP 130/89 (BP Location: Right Arm)   Pulse 100   Temp 98.5 F (36.9 C)   Resp 18   SpO2 100%  Physical Exam Vitals and nursing note reviewed.  Constitutional:      General: He is not in acute distress.    Appearance: He is not toxic-appearing.  HENT:     Head: Normocephalic and atraumatic.     Nose: Nose normal.     Mouth/Throat:     Mouth: Mucous membranes are moist.  Eyes:     Extraocular Movements: Extraocular movements intact.     Conjunctiva/sclera: Conjunctivae normal.     Pupils: Pupils are equal, round, and reactive to light.  Cardiovascular:     Rate and Rhythm: Normal rate.     Pulses: Normal pulses.     Heart sounds: Normal heart sounds.  Pulmonary:     Effort: Pulmonary effort is normal.     Breath sounds: Normal breath sounds.  Abdominal:     General: Abdomen is flat.     Palpations: Abdomen is soft.  Musculoskeletal:        General: No swelling or deformity. Normal range of motion.     Cervical back: Normal range of motion. No tenderness.  Comments: Tenderness over the coccyx without any obvious deformities or deficits.  Normal back range of motion and full strength in the lower extremities.  He appears uncomfortable when he is standing or gets up from sitting.  However, he is able to perform these movements without obvious deficit.  Skin:    General: Skin is warm and dry.     Capillary Refill: Capillary refill takes less than 2 seconds.  Neurological:     General: No focal deficit present.     Mental Status: He is alert and oriented to person, place, and time. Mental status is at baseline.     Cranial Nerves: No cranial nerve deficit.     Sensory: No sensory deficit.     Motor: No weakness.  Psychiatric:        Mood and Affect: Mood normal.     ED Results / Procedures / Treatments   Labs (all labs ordered are listed,  but only abnormal results are displayed) Labs Reviewed - No data to display  EKG None  Radiology DG Pelvis 1-2 Views  Result Date: 09/04/2022 CLINICAL DATA:  Pain after fall. EXAM: PELVIS - 1-2 VIEW COMPARISON:  None Available. FINDINGS: There is no evidence of pelvic fracture or diastasis. No pelvic bone lesions are seen. IMPRESSION: Negative. Electronically Signed   By: Lupita Raider M.D.   On: 09/04/2022 08:49   DG Sacrum/Coccyx  Result Date: 09/04/2022 CLINICAL DATA:  Pain after fall. EXAM: SACRUM AND COCCYX - 2+ VIEW COMPARISON:  June 20, 2012. FINDINGS: There is no evidence of fracture or other focal bone lesions. IMPRESSION: Negative. Electronically Signed   By: Lupita Raider M.D.   On: 09/04/2022 08:48   DG Lumbar Spine Complete  Result Date: 09/04/2022 CLINICAL DATA:  Back pain for 1 day after fall. EXAM: LUMBAR SPINE - COMPLETE 4+ VIEW COMPARISON:  None Available. FINDINGS: Corticated L5 pars defects, also seen on a 2014 abdominal CT. Mild L5-S1 anterolisthesis. No evidence of acute fracture or bone lesion. IMPRESSION: 1. No acute finding. 2. 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 Electronically Signed   By: Tiburcio Pea M.D.   On: 09/04/2022 07:18    Procedures Procedures    Medications Ordered in ED Medications  ibuprofen (ADVIL) tablet 600 mg (600 mg Oral Given 09/04/22 1610)    ED Course/ Medical Decision Making/ A&P                             Medical Decision Making 27 year old male presenting to the emergency department after a recent fall causing coccyx pain.  Differential includes lumbar spinal fracture, muscle strain, coccyx fracture, pelvis fracture, soft tissue contusion, and others.  He is ambulatory and able to perform all motor functions, but is limited by some discomfort.  He received lumbar x-rays in triage, which showed no acute abnormalities at this time.  He does have a chronic L5-S1 anteriolisthesis with L5 pars defect that  appears to be present on prior CTs in 2014.  There does not appear to be any acute changes from those images.  Patient does admit to history of IV drug use but states his pain did happen after his recent fall while at work and he is denying any recent fevers, chest pain, or shortness of breath.  We will go ahead with adding on x-rays of the pelvis and coccyx.  Patient aware that even with a coccyx fracture he will need  to rest and not skateboard over the next few days.  He is neurologically intact and has no back pain red flag symptoms. XR coccyx and pelvis were unremarkable. Motrin given for pain and patient updated on results. Stable for discharge and supportive care discussed. Stable for DC.   Amount and/or Complexity of Data Reviewed Independent Historian: parent Radiology: ordered. Decision-making details documented in ED Course.    Final Clinical Impression(s) / ED Diagnoses Final diagnoses:  Coccyx pain  Muscle strain    Rx / DC Orders ED Discharge Orders     None         Decklyn Hornik, DO 09/04/22 0901

## 2022-09-04 NOTE — ED Notes (Signed)
Patient transported to X-ray 

## 2022-09-04 NOTE — Discharge Instructions (Signed)
Continue to rest as you recover from your fall.  We recommend avoiding any strenuous activity or lifting any heavy objects until you are back feels better.  Return to the emergency department if you have any worsening pain or any other concerning symptoms.  Continue to take Advil or Tylenol for your symptoms.

## 2022-09-04 NOTE — ED Triage Notes (Signed)
Lower back pain starting a day ago. Has not taken any meds for it. Hurts worse when sitting.

## 2022-09-15 ENCOUNTER — Emergency Department (HOSPITAL_COMMUNITY)
Admission: EM | Admit: 2022-09-15 | Discharge: 2022-09-15 | Disposition: A | Discharge status: Home / Self Care | Payer: Medicaid Other | Attending: Emergency Medicine | Admitting: Emergency Medicine

## 2022-09-15 ENCOUNTER — Encounter (HOSPITAL_COMMUNITY): Payer: Self-pay | Admitting: *Deleted

## 2022-09-15 ENCOUNTER — Other Ambulatory Visit: Payer: Self-pay

## 2022-09-15 DIAGNOSIS — W57XXXA Bitten or stung by nonvenomous insect and other nonvenomous arthropods, initial encounter: Secondary | ICD-10-CM | POA: Diagnosis not present

## 2022-09-15 DIAGNOSIS — S79911A Unspecified injury of right hip, initial encounter: Secondary | ICD-10-CM | POA: Diagnosis present

## 2022-09-15 DIAGNOSIS — S70261A Insect bite (nonvenomous), right hip, initial encounter: Secondary | ICD-10-CM | POA: Insufficient documentation

## 2022-09-15 DIAGNOSIS — R591 Generalized enlarged lymph nodes: Secondary | ICD-10-CM | POA: Diagnosis not present

## 2022-09-15 MED ORDER — DOXYCYCLINE HYCLATE 100 MG PO CAPS: 100.0000 mg | ORAL_CAPSULE | Freq: Two times a day (BID) | ORAL | 0 refills | Status: DC

## 2022-09-15 MED ORDER — DOXYCYCLINE HYCLATE 100 MG PO TABS
100.0000 mg | ORAL_TABLET | Freq: Once | ORAL | Status: AC
  Administered 2022-09-15: 100 mg via ORAL
  Filled 2022-09-15: qty 1

## 2022-09-15 NOTE — ED Triage Notes (Signed)
The pt reports that he was bitten by a spider 3 days ago and he still has pain in his rt hip

## 2022-09-15 NOTE — ED Provider Notes (Signed)
[place] EMERGENCY DEPARTMENT AT [facility] Provider Note   CSN: [id number] Arrival date & time: 09/15/22  1554     History  Chief Complaint  Patient presents with   Insect Bite    Ronald Greene is a 27 y.o. male history of syphilis, hepatitis C, IVDU presented with 3 days of a bug bite in the right hip along with right-sided inguinal lymphadenopathy.  Patient stated that he was walking through cobweb when he noticed a brown spider into his jagged and believes that the spider bit him on the right hip.  Patient started noticing the lymphadenopathy a little bit later that day and states that the bite hurts.  Patient notes that at the bite site the skin feels tight there along with tenderness to palpation but denies any skin color changes.  Patient stated that he has not had sex since being diagnosed with syphilis and denies any dysuria, penile lesions, penile discharge, testicle pain.  Patient states that the last time he used IV drugs was a month ago when he overdosed and has not touched them since.  Patient denies any skin color changes, change in sensation of his motor skills, back pain, urinary/bowel symptoms, fever/chills, nausea/vomiting, abdominal pain  Home Medications Prior to Admission medications   Medication Sig Start Date End Date Taking? Authorizing Provider  doxycycline (VIBRAMYCIN) 100 MG capsule Take 1 capsule (100 mg total) by mouth 2 (two) times daily. 09/15/22   Netta Corrigan, PA-C  emtricitabine-tenofovir AF (DESCOVY) 200-25 MG tablet Take 1 tablet by mouth daily. 10/23/21   Jennette Kettle, RPH-CPP  naloxone [facility]) nasal spray 4 mg/0.1 mL Take as directed 07/30/22   Horton, Clabe Seal, DO      Allergies    Aloe    Review of Systems   Review of Systems See HPI Physical Exam Updated Vital Signs BP 126/89 (BP Location: Right Arm)   Pulse (!) 102   Temp 98.4 F (36.9 C) (Oral)   Resp 18   Ht 6' (1.829 m)   Wt 86.2 kg   SpO2 100%   BMI 25.77  kg/m  Physical Exam Constitutional:      General: He is not in acute distress. Abdominal:     Palpations: Abdomen is soft.     Tenderness: There is no abdominal tenderness. There is no guarding or rebound.  Genitourinary:    Comments: Chaperone: Aggie Cosier, RN No penile lesions noted No tenderness to the epididymis bilaterally, no testicular tenderness No penile discharge No scrotal swelling noted Musculoskeletal:        General: Normal range of motion.  Lymphadenopathy:     Comments: Right inguinal lymph nodes: Enlarged and tender to palpation  Skin:    General: Skin is warm and dry.     Comments: Right hip: Dark 0.5 cm papule with 1 cm of surrounding tenderness without fluctuance or skin color changes  Neurological:     Mental Status: He is alert.     Comments: Sensation intact distally     ED Results / Procedures / Treatments   Labs (all labs ordered are listed, but only abnormal results are displayed) Labs Reviewed - No data to display  EKG None  Radiology No results found.  Procedures Procedures    Medications Ordered in ED Medications  doxycycline (VIBRA-TABS) tablet 100 mg (100 mg Oral Given 09/15/22 2103)    ED Course/ Medical Decision Making/ A&P  Medical Decision Making  Ronald Greene 27 y.o. presented today for insect bite. Working DDx that I considered at this time includes, but not limited to, brown recluse/black widow bite, syphilis, cellulitis/abscess, chancroid.  R/o DDx: black widow bite, syphilis, cellulitis/abscess, chancroid: These are considered less likely due to history of present illness and physical exam findings  Review of prior external notes: 09/04/2022 ED  Unique Tests and My Interpretation: None  Discussion with Independent Historian: None  Discussion of Management of Tests: None  Risk: Medium: prescription drug management  Risk Stratification Score: None  Plan: Patient presented for insect  bite. On exam patient was no acute distress and stable vitals.  On exam patient did have black papule with surrounding tenderness on his right hip along with right inguinal lymphadenopathy that was tender to palpation.  Patient denied any fevers, recent IVDU, changes sensations motor skills, skin color changes but did state the bite mark hurts when he presses on it.  No area of fluctuance is appreciated on exam.  I suspect patient's lymphadenopathy is reactive to patient's spider bite.  Patient's heart rate is 102 however I I do not suspect any cardiac pathology at this time.  Due to patient's high risk nature including IVDU patient will be given doxycycline for his bug bite with primary care follow-up.  Patient's GU exam with a chaperone was unremarkable for any penile lesions or discharge.  Patient will be given 1 dose of the doxycycline in the ED and prescribed the doxycycline.  Patient was given return precautions. Patient stable for discharge at this time.  Patient verbalized understanding of plan.         Final Clinical Impression(s) / ED Diagnoses Final diagnoses:  Insect bite of right hip, initial encounter  Lymphadenopathy    Rx / DC Orders ED Discharge Orders          Ordered    doxycycline (VIBRAMYCIN) 100 MG capsule  2 times daily        09/15/22 2053              Remi Deter 09/15/22 2104    Linwood Dibbles, MD 09/16/22 [phone]

## 2022-09-15 NOTE — Discharge Instructions (Addendum)
Please pick up the antibiotics I prescribed for you regarding your recent bug bite.  Today in the ED your given 1 dose of this antibiotic but you will need to continue in the outpatient setting.  I have also given you a primary care provider for you to follow-up with regarding recent symptoms and ER visit.  I suspect this is most likely a brown recluse bite and in the next few days you may have skin color changes and signs of ulceration which are all expected and you may take ibuprofen every 6 hours as needed for pain.  It is very important they follow-up with your primary care provider as symptoms may change.  If symptoms begin to worsen please return to ER.

## 2022-09-15 NOTE — ED Notes (Signed)
Pt presents ambulatory to room with steady gait c/o possible insect bite to right buttocks x 3 days. Area is swollen has tiny hard dark center and is painful and warm to palpation. Pt a/o x 4 respirations even and non labored skin intact everywhere else

## 2022-09-15 NOTE — ED Provider Triage Note (Signed)
Emergency Medicine Provider Triage Evaluation Note  Ronald Greene , a 27 y.o. male  was evaluated in triage.  Pt complains of concern of being bit by a spider.  He states he walked through a spiderweb 3 days ago.  He states the swelling is getting worse.  Denies fever, chills.  No other complaints..  Review of Systems  Positive: As above Negative: As above  Physical Exam  BP (!) 128/98 (BP Location: Right Arm)   Pulse (!) 106   Temp 98.5 F (36.9 C) (Oral)   Resp 18   Ht 6' (1.829 m)   Wt 86.2 kg   SpO2 99%   BMI 25.77 kg/m  Gen:   Awake, no distress   Resp:  Normal effort  MSK:   Moves extremities without difficulty  Other:    Medical Decision Making  Medically screening exam initiated at 4:09 PM.  Appropriate orders placed.  Ronald Greene was informed that the remainder of the evaluation will be completed by another provider, this initial triage assessment does not replace that evaluation, and the importance of remaining in the ED until their evaluation is complete.     Marita Kansas, PA-C 09/15/22 1610

## 2022-09-26 ENCOUNTER — Ambulatory Visit (INDEPENDENT_AMBULATORY_CARE_PROVIDER_SITE_OTHER): Payer: Medicaid Other

## 2022-09-26 ENCOUNTER — Ambulatory Visit (HOSPITAL_COMMUNITY)
Admission: EM | Admit: 2022-09-26 | Discharge: 2022-09-26 | Disposition: A | Discharge status: Home / Self Care | Payer: Medicaid Other | Attending: Internal Medicine | Admitting: Internal Medicine

## 2022-09-26 ENCOUNTER — Encounter (HOSPITAL_COMMUNITY): Payer: Self-pay

## 2022-09-26 DIAGNOSIS — M25551 Pain in right hip: Secondary | ICD-10-CM

## 2022-09-26 DIAGNOSIS — M898X5 Other specified disorders of bone, thigh: Secondary | ICD-10-CM | POA: Diagnosis not present

## 2022-09-26 MED ORDER — KETOROLAC TROMETHAMINE 15 MG/ML IJ SOLN
15.0000 mg | Freq: Once | INTRAMUSCULAR | Status: AC
  Administered 2022-09-26: 15 mg via INTRAMUSCULAR

## 2022-09-26 MED ORDER — KETOROLAC TROMETHAMINE 30 MG/ML IJ SOLN
INTRAMUSCULAR | Status: AC
  Filled 2022-09-26: qty 1
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
## 2022-09-26 NOTE — Discharge Instructions (Signed)
Your x-rays showed no broken bones or dislocations. They were sent to a radiologist for further evaluation and we will call you if the radiologist sees anything different.   You were given an injection (Toradol) for your leg pain today. This will help with the pain and inflammation.   Recommend rest, ice, compression, and elevation. You can take NSAIDs for your pain. Avoid any more NSAIDs today given your injection today.   Return in 2 to 3 days if no improvement. It is very important for you to pay attention to any new symptoms or worsening of your current condition. Please go directly to the Emergency Department immediately should you begin to feel worse in any way or have any of the following symptoms: persistent fevers, increased pain, increased swelling, chest pain, shortness of breath or color changes.

## 2022-09-26 NOTE — ED Triage Notes (Signed)
Patient c/o right thigh pain  x 2 days.  Patient states he has taken Tylenol and the last dose ws yesterday.

## 2022-10-21 ENCOUNTER — Emergency Department (HOSPITAL_COMMUNITY): Payer: Medicaid Other

## 2022-10-21 ENCOUNTER — Other Ambulatory Visit: Payer: Self-pay

## 2022-10-21 ENCOUNTER — Encounter (HOSPITAL_COMMUNITY): Payer: Self-pay

## 2022-10-21 ENCOUNTER — Emergency Department (HOSPITAL_COMMUNITY)
Admission: EM | Admit: 2022-10-21 | Discharge: 2022-10-22 | Disposition: A | Discharge status: Home / Self Care | Payer: Medicaid Other | Attending: Emergency Medicine | Admitting: Emergency Medicine

## 2022-10-21 DIAGNOSIS — M79632 Pain in left forearm: Secondary | ICD-10-CM | POA: Insufficient documentation

## 2022-10-21 DIAGNOSIS — M7989 Other specified soft tissue disorders: Secondary | ICD-10-CM | POA: Diagnosis not present

## 2022-10-21 DIAGNOSIS — M79602 Pain in left arm: Secondary | ICD-10-CM

## 2022-10-21 LAB — CBC WITH DIFFERENTIAL/PLATELET
Abs Immature Granulocytes: 0.02 10*3/uL (ref 0.00–0.07)
Basophils Absolute: 0 10*3/uL (ref 0.0–0.1)
Basophils Relative: 0 %
Eosinophils Absolute: 0.1 10*3/uL (ref 0.0–0.5)
Eosinophils Relative: 1 %
HCT: 39.9 % (ref 39.0–52.0)
Hemoglobin: 12.8 g/dL — ABNORMAL LOW (ref 13.0–17.0)
Immature Granulocytes: 0 %
Lymphocytes Relative: 35 %
Lymphs Abs: 3.8 10*3/uL (ref 0.7–4.0)
MCH: 31 pg (ref 26.0–34.0)
MCHC: 32.1 g/dL (ref 30.0–36.0)
MCV: 96.6 fL (ref 80.0–100.0)
Monocytes Absolute: 1.3 10*3/uL — ABNORMAL HIGH (ref 0.1–1.0)
Monocytes Relative: 12 %
Neutro Abs: 5.6 10*3/uL (ref 1.7–7.7)
Neutrophils Relative %: 52 %
Platelets: 289 10*3/uL (ref 150–400)
RBC: 4.13 MIL/uL — ABNORMAL LOW (ref 4.22–5.81)
RDW: 13.8 % (ref 11.5–15.5)
WBC: 10.8 10*3/uL — ABNORMAL HIGH (ref 4.0–10.5)
nRBC: 0 % (ref 0.0–0.2)

## 2022-10-21 LAB — BASIC METABOLIC PANEL
Anion gap: 8 (ref 5–15)
BUN: 11 mg/dL (ref 6–20)
CO2: 24 mmol/L (ref 22–32)
Calcium: 8.6 mg/dL — ABNORMAL LOW (ref 8.9–10.3)
Chloride: 100 mmol/L (ref 98–111)
Creatinine, Ser: 0.84 mg/dL (ref 0.61–1.24)
GFR, Estimated: >60 mL/min (ref >60)
Glucose, Bld: 76 mg/dL (ref 70–99)
Potassium: 3.9 mmol/L (ref 3.5–5.1)
Sodium: 132 mmol/L — ABNORMAL LOW (ref 135–145)

## 2022-10-21 MED ORDER — SULFAMETHOXAZOLE-TRIMETHOPRIM 800-160 MG PO TABS: 1.0000 | ORAL_TABLET | Freq: Two times a day (BID) | ORAL | 0 refills | Status: AC

## 2022-10-21 MED ORDER — SULFAMETHOXAZOLE-TRIMETHOPRIM 800-160 MG PO TABS
1.0000 | ORAL_TABLET | Freq: Once | ORAL | Status: AC
  Administered 2022-10-22: 1 via ORAL
  Filled 2022-10-21: qty 1

## 2022-10-21 NOTE — ED Triage Notes (Signed)
Pt c/o pain and swelling in left arm. Pt denies injury. Pt has 4+ swelling of left forearm. Pt has a small wound on left forearm. Pt admits to using IV drugs last night. Pt states the arm got swollen this morning. Pt has 1+ left radial pulse, cap refill less than 3 sec, warm to touch, 3/5 left grip strength.

## 2022-10-21 NOTE — ED Provider Notes (Signed)
[facility] EMERGENCY DEPARTMENT AT [facility] Provider Note   CSN: [id number] Arrival date & time: 10/21/22  1304     History  Chief Complaint  Patient presents with   Arm Pain    Ronald Greene is a 27 y.o. male.  HPI   27 year old male presents emergency department with left forearm pain.  Patient admits to injecting drugs in the arm before.  States over the past week has been having some left arm discomfort and redness.  Patient otherwise denies any fever, chills or systemic symptoms.  Denies any chest pain or shortness of breath.  No lower extremity swelling.  Home Medications Prior to Admission medications   Medication Sig Start Date End Date Taking? Authorizing Provider  sulfamethoxazole-trimethoprim (BACTRIM DS) 800-160 MG tablet Take 1 tablet by mouth 2 (two) times daily for 7 days. 10/21/22 10/28/22 Yes Ellysia Char, Clabe Seal, DO  doxycycline (VIBRAMYCIN) 100 MG capsule Take 1 capsule (100 mg total) by mouth 2 (two) times daily. 09/15/22   Netta Corrigan, PA-C  emtricitabine-tenofovir AF (DESCOVY) 200-25 MG tablet Take 1 tablet by mouth daily. 10/23/21   Jennette Kettle, RPH-CPP  naloxone [facility]) nasal spray 4 mg/0.1 mL Take as directed 07/30/22   Jaycey Gens, Clabe Seal, DO      Allergies    Aloe    Review of Systems   Review of Systems  Constitutional:  Negative for fever.  Respiratory:  Negative for chest tightness and shortness of breath.   Cardiovascular:  Negative for chest pain and leg swelling.  Gastrointestinal:  Negative for abdominal pain.  Genitourinary:  Negative for flank pain.  Musculoskeletal:  Negative for back pain.       Left forearm pain and swelling    Physical Exam Updated Vital Signs BP 105/66 (BP Location: Right Arm)   Pulse 80   Temp 97.6 F (36.4 C)   Resp 17   Ht 6' (1.829 m)   Wt 86.2 kg   SpO2 100%   BMI 25.77 kg/m  Physical Exam Vitals and nursing note reviewed.  Constitutional:      Appearance: Normal appearance.  HENT:      Head: Normocephalic.     Mouth/Throat:     Mouth: Mucous membranes are moist.  Cardiovascular:     Rate and Rhythm: Normal rate.  Pulmonary:     Effort: Pulmonary effort is normal. No respiratory distress.  Musculoskeletal:     Comments: Left forearm is mildly edematous and erythematous.  Slightly warm to the touch, equal palpable radial pulses, equal grip strength, sensory intact.  No focal palpable fluctuance or abscess.  Skin:    General: Skin is warm.  Neurological:     Mental Status: He is alert and oriented to person, place, and time. Mental status is at baseline.  Psychiatric:        Mood and Affect: Mood normal.     ED Results / Procedures / Treatments   Labs (all labs ordered are listed, but only abnormal results are displayed) Labs Reviewed  BASIC METABOLIC PANEL - Abnormal; Notable for the following components:      Result Value   Sodium 132 (*)    Calcium 8.6 (*)    All other components within normal limits  CBC WITH DIFFERENTIAL/PLATELET - Abnormal; Notable for the following components:   WBC 10.8 (*)    RBC 4.13 (*)    Hemoglobin 12.8 (*)    Monocytes Absolute 1.3 (*)    All other components within  normal limits    EKG None  Radiology DG Forearm Left  Result Date: 10/21/2022 CLINICAL DATA:  arm pain EXAM: LEFT FOREARM - 2 VIEW COMPARISON:  None Available. FINDINGS: There is no evidence of fracture or other focal bone lesions. Diffuse subcutaneus soft tissue edema. IMPRESSION: No acute displaced fracture or dislocation. Electronically Signed   By: Tish Frederickson M.D.   On: 10/21/2022 22:10    Procedures Procedures    Medications Ordered in ED Medications  sulfamethoxazole-trimethoprim (BACTRIM DS) 800-160 MG per tablet 1 tablet (has no administration in time range)    ED Course/ Medical Decision Making/ A&P                             Medical Decision Making Amount and/or Complexity of Data Reviewed Labs: ordered. Radiology:  ordered.  Risk Prescription drug management.   27 year old male presents emergency department with left forearm swelling and redness.  Admits to injecting drugs into that arm.  Patient is afebrile.  The left forearm is edematous, erythematous and slightly warm to the touch.  There is no focal fluctuance or findings of abscess.  The left hand is neurovascularly intact.  Blood work is reassuring, no findings of sepsis.  X-ray shows no subcutaneous air.  However still concern for DVT.  We do not vascular ultrasound at this time.  I discussed with the patient in detail the need to come back tomorrow morning for vascular ultrasound to rule out DVT.  I expressed the importance of this and how an undiagnosed DVT could lead to PE, disability and death.  He understands this.  Otherwise patient will be prescribed antibiotics to be treated for what appears to be left forearm cellulitis.  He was given strict return to ED instructions as his history of IV drug use predisposes him to complications. Patient understands.   Patient at this time appears safe and stable for discharge and close outpatient follow up. Discharge plan and strict return to ED precautions discussed, patient verbalizes understanding and agreement.         Final Clinical Impression(s) / ED Diagnoses Final diagnoses:  Left arm pain    Rx / DC Orders ED Discharge Orders          Ordered    UE VENOUS DUPLEX        10/21/22 2323    sulfamethoxazole-trimethoprim (BACTRIM DS) 800-160 MG tablet  2 times daily        10/21/22 2323              Rozelle Logan, DO 10/21/22 2334

## 2022-10-21 NOTE — Discharge Instructions (Addendum)
You have been seen and discharged from the emergency department.  You appear to have an infection in your left forearm.  Take antibiotics as directed.  I am also concerned that you could have developed a blood clot.  You need to come back to registration tomorrow during business hours to be referred to vascular for ultrasound to rule out blood clot.    **This is extremely important to do so.  If you have a blood clot and this goes undiagnosed or untreated this could result in severe disability or even death.  Follow-up with your primary provider for further evaluation and further care. Take home medications as prescribed. If you have any worsening symptoms or further concerns for your health please return to an emergency department for further evaluation.

## 2022-10-22 ENCOUNTER — Ambulatory Visit (HOSPITAL_COMMUNITY): Payer: Medicaid Other | Attending: Emergency Medicine

## 2022-10-23 ENCOUNTER — Other Ambulatory Visit: Payer: Self-pay

## 2022-10-23 ENCOUNTER — Emergency Department (HOSPITAL_COMMUNITY): Payer: Medicaid Other

## 2022-10-23 ENCOUNTER — Encounter (HOSPITAL_COMMUNITY): Payer: Self-pay | Admitting: Emergency Medicine

## 2022-10-23 ENCOUNTER — Emergency Department (HOSPITAL_COMMUNITY)
Admission: EM | Admit: 2022-10-23 | Discharge: 2022-10-24 | Discharge status: Against Medical Advice | Payer: Medicaid Other | Attending: Emergency Medicine | Admitting: Emergency Medicine

## 2022-10-23 DIAGNOSIS — Z5321 Procedure and treatment not carried out due to patient leaving prior to being seen by health care provider: Secondary | ICD-10-CM | POA: Diagnosis not present

## 2022-10-23 DIAGNOSIS — L03114 Cellulitis of left upper limb: Secondary | ICD-10-CM | POA: Insufficient documentation

## 2022-10-23 DIAGNOSIS — R079 Chest pain, unspecified: Secondary | ICD-10-CM | POA: Diagnosis not present

## 2022-10-23 DIAGNOSIS — M7989 Other specified soft tissue disorders: Secondary | ICD-10-CM | POA: Insufficient documentation

## 2022-10-23 DIAGNOSIS — R0602 Shortness of breath: Secondary | ICD-10-CM | POA: Insufficient documentation

## 2022-10-23 LAB — URINALYSIS, W/ REFLEX TO CULTURE (INFECTION SUSPECTED)
Bacteria, UA: NONE SEEN
Bilirubin Urine: NEGATIVE
Glucose, UA: NEGATIVE mg/dL
Hgb urine dipstick: NEGATIVE
Ketones, ur: NEGATIVE mg/dL
Leukocytes,Ua: NEGATIVE
Nitrite: NEGATIVE
Protein, ur: NEGATIVE mg/dL
Specific Gravity, Urine: 1.024 (ref 1.005–1.030)
pH: 5 (ref 5.0–8.0)

## 2022-10-23 LAB — BASIC METABOLIC PANEL
Anion gap: 8 (ref 5–15)
BUN: 9 mg/dL (ref 6–20)
CO2: 26 mmol/L (ref 22–32)
Calcium: 8.8 mg/dL — ABNORMAL LOW (ref 8.9–10.3)
Chloride: 99 mmol/L (ref 98–111)
Creatinine, Ser: 0.89 mg/dL (ref 0.61–1.24)
GFR, Estimated: >60 mL/min (ref >60)
Glucose, Bld: 92 mg/dL (ref 70–99)
Potassium: 3.7 mmol/L (ref 3.5–5.1)
Sodium: 133 mmol/L — ABNORMAL LOW (ref 135–145)

## 2022-10-23 LAB — CBC
HCT: 38.7 % — ABNORMAL LOW (ref 39.0–52.0)
Hemoglobin: 12.5 g/dL — ABNORMAL LOW (ref 13.0–17.0)
MCH: 30 pg (ref 26.0–34.0)
MCHC: 32.3 g/dL (ref 30.0–36.0)
MCV: 93 fL (ref 80.0–100.0)
Platelets: 297 10*3/uL (ref 150–400)
RBC: 4.16 MIL/uL — ABNORMAL LOW (ref 4.22–5.81)
RDW: 13.5 % (ref 11.5–15.5)
WBC: 8 10*3/uL (ref 4.0–10.5)
nRBC: 0 % (ref 0.0–0.2)

## 2022-10-23 LAB — TROPONIN I (HIGH SENSITIVITY): Troponin I (High Sensitivity): 4 ng/L (ref <18)

## 2022-10-23 LAB — PROTIME-INR
INR: 1.2 (ref 0.8–1.2)
Prothrombin Time: 15 seconds (ref 11.4–15.2)

## 2022-10-23 LAB — LACTIC ACID, PLASMA: Lactic Acid, Venous: 1 mmol/L (ref 0.5–1.9)

## 2022-10-23 NOTE — ED Triage Notes (Signed)
Pt in with worsening L arm swelling. Seen 5/30 and placed on Bactrim, but pt never filled the rx. Pt was also instructed to return next day for vasc US to r/o DVT, but did not make appt. 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 Pt reports cp and sob today.

## 2022-10-23 NOTE — ED Notes (Signed)
Pt called for X4 by both NT and Phlebotomist to come back to triage. Pt could not be located.

## 2022-10-24 ENCOUNTER — Encounter (HOSPITAL_COMMUNITY): Payer: Self-pay | Admitting: *Deleted

## 2022-10-24 ENCOUNTER — Emergency Department (HOSPITAL_BASED_OUTPATIENT_CLINIC_OR_DEPARTMENT_OTHER): Payer: Medicaid Other

## 2022-10-24 ENCOUNTER — Emergency Department (HOSPITAL_COMMUNITY)
Admission: EM | Admit: 2022-10-24 | Discharge: 2022-10-24 | Disposition: A | Discharge status: Home / Self Care | Payer: Medicaid Other | Source: Home / Self Care | Attending: Emergency Medicine | Admitting: Emergency Medicine

## 2022-10-24 ENCOUNTER — Other Ambulatory Visit: Payer: Self-pay

## 2022-10-24 DIAGNOSIS — M7989 Other specified soft tissue disorders: Secondary | ICD-10-CM

## 2022-10-24 DIAGNOSIS — L03114 Cellulitis of left upper limb: Secondary | ICD-10-CM | POA: Insufficient documentation

## 2022-10-24 DIAGNOSIS — L089 Local infection of the skin and subcutaneous tissue, unspecified: Secondary | ICD-10-CM

## 2022-10-24 HISTORY — DX: Dermatitis, unspecified: L30.9

## 2022-10-24 LAB — CULTURE, BLOOD (ROUTINE X 2)
Culture: NO GROWTH
Special Requests: ADEQUATE

## 2022-10-24 MED ORDER — DEXTROSE 5 % IV SOLN
1500.0000 mg | Freq: Once | INTRAVENOUS | Status: DC
  Filled 2022-10-24: qty 75

## 2022-10-24 NOTE — ED Triage Notes (Signed)
Pt states infection to L arm from IV drug use (last injected 5/29).  States missed appointment for [place] on Friday am and has not filled antibiotic rx.  States swelling and pain in arm is increasing.

## 2022-10-24 NOTE — Discharge Instructions (Addendum)
You were seen in the emergency department for left arm swelling and redness.  As we discussed I think the area on your arm is infected.  The ultrasound did not show any evidence of a blood clot.  We gave you the antibiotic for the infection.  I would like you to follow-up with the infectious disease provider, and I have attached their contact information.  Continue to monitor how you're doing and return to the ER for new or worsening symptoms.

## 2022-10-24 NOTE — ED Provider Notes (Signed)
[place] EMERGENCY DEPARTMENT AT [facility] Provider Note   CSN: [id number] Arrival date & time: 10/24/22  1106     History  Chief Complaint  Patient presents with   Arm Pain    Ronald Greene is a 27 y.o. male with history of heroin abuse, syphilis, eczema who presents the emergency department complaining of pain and swelling in his left arm.  Patient believes this is related to his IV drug use, last injected on 5/29.  He came to the ER a few days ago, but missed his follow-up appointment for an arm ultrasound, and was not able to pick up the antibiotics prescription as he could not afford it.  He states that the swelling and pain in his arm has been increasing since then.  No fever.   Arm Pain       Home Medications Prior to Admission medications   Medication Sig Start Date End Date Taking? Authorizing Provider  doxycycline (VIBRAMYCIN) 100 MG capsule Take 1 capsule (100 mg total) by mouth 2 (two) times daily. 09/15/22   Netta Corrigan, PA-C  emtricitabine-tenofovir AF (DESCOVY) 200-25 MG tablet Take 1 tablet by mouth daily. 10/23/21   Jennette Kettle, RPH-CPP  naloxone [facility]) nasal spray 4 mg/0.1 mL Take as directed 07/30/22   Horton, Clabe Seal, DO  sulfamethoxazole-trimethoprim (BACTRIM DS) 800-160 MG tablet Take 1 tablet by mouth 2 (two) times daily for 7 days. 10/21/22 10/28/22  Horton, Clabe Seal, DO      Allergies    Aloe    Review of Systems   Review of Systems  Constitutional:  Negative for chills and fever.  Skin:  Positive for color change and wound.  All other systems reviewed and are negative.   Physical Exam Updated Vital Signs BP 138/85 (BP Location: Right Arm)   Pulse 92   Temp 98.5 F (36.9 C) (Oral)   Resp 15   Ht 6' (1.829 m)   Wt 86.2 kg   SpO2 98%   BMI 25.77 kg/m  Physical Exam Vitals and nursing note reviewed.  Constitutional:      Appearance: Normal appearance.  HENT:     Head: Normocephalic and atraumatic.  Eyes:      Conjunctiva/sclera: Conjunctivae normal.  Cardiovascular:     Pulses:          Radial pulses are 2+ on the right side and 2+ on the left side.  Pulmonary:     Effort: Pulmonary effort is normal. No respiratory distress.  Musculoskeletal:     Comments: Normal ROM of the left wrist, elbow and shoulder  Skin:    General: Skin is warm and dry.     Capillary Refill: Capillary refill takes less than 2 seconds.     Comments: Open wound to the left lateral forearm as imaged below with circumferential erythema. About 5 cm of induration around the wound without fluctuance, small amount of serosanguinous drainage  Neurological:     Mental Status: He is alert.  Psychiatric:        Mood and Affect: Mood normal.        Behavior: Behavior normal.            ED Results / Procedures / Treatments   Labs (all labs ordered are listed, but only abnormal results are displayed) Labs Reviewed - No data to display  EKG None  Radiology UE VENOUS DUPLEX (7am - 7pm)  Result Date: 10/24/2022 UPPER VENOUS STUDY  Patient Name:  Ronald Greene  Date of Exam:   10/24/2022 Medical Rec #: [id number]        Accession #:    [id number] Date of Birth: [birth year]        Patient Gender: M Patient Age:   81 years Exam Location:  [facility] Procedure:      VAS [place] UPPER EXTREMITY VENOUS DUPLEX Referring Phys: Ronald Greene --------------------------------------------------------------------------------  Indications: Pain, and Swelling Risk Factors: IV drug use. Comparison Study: No prior study on file Performing Technologist: Ronald Greene RVS  Examination Guidelines: A complete evaluation includes B-mode imaging, spectral Doppler, color Doppler, and power Doppler as needed of all accessible portions of each vessel. Bilateral testing is considered an integral part of a complete examination. Limited examinations for reoccurring indications may be performed as noted.  Right Findings:  +----------+------------+---------+-----------+----------+-------+ RIGHT     CompressiblePhasicitySpontaneousPropertiesSummary +----------+------------+---------+-----------+----------+-------+ Subclavian               Yes       Yes                      +----------+------------+---------+-----------+----------+-------+  Left Findings: +----------+------------+---------+-----------+----------+-------+ LEFT      CompressiblePhasicitySpontaneousPropertiesSummary +----------+------------+---------+-----------+----------+-------+ IJV           Full       Yes       Yes                      +----------+------------+---------+-----------+----------+-------+ Subclavian               Yes       Yes                      +----------+------------+---------+-----------+----------+-------+ Axillary                 Yes       Yes                      +----------+------------+---------+-----------+----------+-------+ Brachial      Full                                          +----------+------------+---------+-----------+----------+-------+ Radial        Full                                          +----------+------------+---------+-----------+----------+-------+ Ulnar         Full                                          +----------+------------+---------+-----------+----------+-------+ Cephalic      Full                                          +----------+------------+---------+-----------+----------+-------+ Basilic       Full                                          +----------+------------+---------+-----------+----------+-------+  Summary:  Right: No evidence  of thrombosis in the subclavian.  Left: No evidence of deep vein thrombosis in the upper extremity. No evidence of superficial vein thrombosis in the upper extremity. Enlarged lymph node noted in mid upper arm. Intersitial edema and tissue disturbance noted in forearm. Further imaging may be  warranted if clinically indicated.  *See table(s) above for measurements and observations.  Diagnosing physician: Ronald Hess MD Electronically signed by Ronald Hess MD on 10/24/2022 at 2:39:15 PM.    Final    DG Chest 2 View  Result Date: 10/23/2022 CLINICAL DATA:  Chest pain EXAM: CHEST - 2 VIEW COMPARISON:  None Available. FINDINGS: The heart size and mediastinal contours are within normal limits. Both lungs are clear. The visualized skeletal structures are unremarkable. IMPRESSION: No active cardiopulmonary disease. Electronically Signed   By: Darliss Cheney M.D.   On: 10/23/2022 23:21    Procedures Procedures    Medications Ordered in ED Medications  dalbavancin (DALVANCE) 1,500 mg in dextrose 5 % 500 mL IVPB (has no administration in time range)    ED Course/ Medical Decision Making/ A&P                             Medical Decision Making  This patient is a 27 y.o. male  who presents to the ED for concern of left arm swelling and pain.   Differential diagnoses prior to evaluation: The emergent differential diagnosis includes, but is not limited to,  cellulitis, abscess, DVT, limb ischemia, compartment syndrome. This is not an exhaustive differential.   Past Medical History / Co-morbidities / Social History: heroin abuse, syphilis, eczema  Additional history: Chart reviewed. Pertinent results include: Reviewed ER visit from 5/30 in which patient was evaluated and discharged with antibiotics, encouraged to return the following day for DVT ultrasound of the left upper extremity.  Did present the following day, but left without being seen.  Reviewed labs significant for no leukocytosis, normal lactic acid, no growth on blood cultures.  Physical Exam: Physical exam performed. The pertinent findings include: Normal vital signs, no acute distress.  Small open wound noted to the left lateral forearm with circumferential erythema and induration, without fluctuance, small  amount of serosanguineous drainage.  Lab Tests/Imaging studies: I personally interpreted labs/imaging and the pertinent results include: Upper extremity ultrasound with no evidence of DVT or SVT, interstitial edema and tissue disturbance in the forearm, with an enlarged lymph node in the mid upper arm.  This is consistent with my clinical diagnosis of cellulitis.  Medications: I ordered medication including Dalvance. Confirmed with ED pharmacist patient is a good candidate for this. I have reviewed the patients home medicines and have made adjustments as needed.   Disposition: After consideration of the diagnostic results and the patients response to treatment, I feel that emergency department workup does not suggest an emergent condition requiring admission or immediate intervention beyond what has been performed at this time. The plan is: discharge to home with infectious disease follow up after dalvance administration. The patient is safe for discharge and has been instructed to return immediately for worsening symptoms, change in symptoms or any other concerns.  I discussed this case with my attending physician Dr. Rodena Medin who cosigned this note including patient's presenting symptoms, physical exam, and planned diagnostics and interventions. Attending physician stated agreement with plan or made changes to plan which were implemented.   1500 -- Have not been able to start IV yet to initiate antibiotics. Patient  seems anxious and wanting to leave. Encouraged to stay. Instructed nursing staff if patient does want to leave it would be AGAINST MEDICAL ADVICE. Oncoming provider and nursing in agreement with this.   Final Clinical Impression(s) / ED Diagnoses Final diagnoses:  Left arm cellulitis    Rx / DC Orders ED Discharge Orders          Ordered    Ambulatory referral to Infectious Disease       Comments: Cellulitis patient:  Received dalbavancin on 10/24/2022.   10/24/22 1320            Portions of this report may have been transcribed using voice recognition software. Every effort was made to ensure accuracy; however, inadvertent computerized transcription errors may be present.    Jeanella Flattery 10/24/22 1500    Wynetta Fines, MD 10/24/22 1536

## 2022-10-24 NOTE — ED Notes (Signed)
IV access attempted by primary RN and 2 secondary Rns x2 each with no success. IV team consult put in for pt access.

## 2022-10-24 NOTE — ED Notes (Signed)
Pt gathered things and left AMA. Pt instructed he should stay to receive IV antibiotics, due to infection in arm. Pt verbalized understanding of risk of leaving without getting antibiotic. Pt stated he has to go now because his ride is here. Pt stated he would come back when he has time to wait.

## 2022-10-24 NOTE — Progress Notes (Signed)
Pharmacy Note:  Dalbavancin for Acute Bacterial Skin and Skin Structure Infection (ABSSSI) Patients to [facility] Discharge  Acelin Giangrasso is an 27 y.o. male who presented to [facility] on 10/24/2022 with an Acute Bacterial Skin and Skin Structure Infection  Inclusion criteria - Indication []  Moderately large skin lesion (>=75 cm2 or larger - about the size of a baseball) [x]  Cellulitis  Inclusion Criteria: Patient is high risk for treatment failure secondary to non-compliance with oral therapy but does not meet admission criteria.  Patient was evaluated for the following exclusion criteria and no exclusions were found  Hardware involvement, Hypotension / shock, Elevated lactate (>2) without other explanation, ram-negative infection risk factors (bites, water exposure, infection after trauma, infection after skin graft, neutropenia, burns, severe immunocompromise), necrotizing fasciitis possible or confirmed, Known or suspected osteomyelitis or septic arthritis, endocarditis, diabetic foot infection, ischemic ulcers, post-operative wound infection, perirectal infections, need for drainage in the operating room, hand or facial infections, injection drug users with a fever, bacteremia, pregnancy or breastfeeding, allergy to related antibiotics like vancomycin, known liver disease (t.bili >2x ULN or AST/ALT 3x ULN)  Delmar Landau, PharmD, BCPS 10/24/2022 1:20 PM ED Clinical Pharmacist -  [phone]

## 2022-10-24 NOTE — ED Notes (Signed)
Pt stated he wanted to be removed from list and he is leaving.

## 2022-10-24 NOTE — Progress Notes (Signed)
VASCULAR LAB    Left upper extremity venous duplex has been performed.  See CV proc for preliminary results.   Jonte Shiller, RVT 10/24/2022, 1:02 PM

## 2022-10-25 ENCOUNTER — Telehealth (HOSPITAL_COMMUNITY): Payer: Self-pay

## 2022-10-25 LAB — CULTURE, BLOOD (ROUTINE X 2)

## 2022-10-25 NOTE — Telephone Encounter (Signed)
Lab called with + Blood culture from pt.s ED visit on 10/23/2022. Blood culture sent to Dr. Suezanne Jacquet and orders received to have patient return to the ED for treatment.  Attempted to contact pt. @ [phone], no answer, message left for pt. To return to the ED for evaluation and to call [phone] for any questions.

## 2022-10-26 ENCOUNTER — Emergency Department (HOSPITAL_COMMUNITY)
Admission: EM | Admit: 2022-10-26 | Discharge: 2022-10-27 | Disposition: A | Discharge status: Home / Self Care | Payer: Medicaid Other | Source: Home / Self Care | Attending: Emergency Medicine | Admitting: Emergency Medicine

## 2022-10-26 ENCOUNTER — Other Ambulatory Visit: Payer: Self-pay

## 2022-10-26 ENCOUNTER — Encounter (HOSPITAL_COMMUNITY): Payer: Self-pay | Admitting: Emergency Medicine

## 2022-10-26 ENCOUNTER — Other Ambulatory Visit (HOSPITAL_COMMUNITY): Payer: Self-pay

## 2022-10-26 ENCOUNTER — Encounter (HOSPITAL_COMMUNITY): Payer: Self-pay

## 2022-10-26 ENCOUNTER — Emergency Department (HOSPITAL_COMMUNITY)
Admission: EM | Admit: 2022-10-26 | Discharge: 2022-10-26 | Discharge status: Against Medical Advice | Payer: Medicaid Other | Attending: Emergency Medicine | Admitting: Emergency Medicine

## 2022-10-26 DIAGNOSIS — Z5321 Procedure and treatment not carried out due to patient leaving prior to being seen by health care provider: Secondary | ICD-10-CM | POA: Insufficient documentation

## 2022-10-26 DIAGNOSIS — R895 Abnormal microbiological findings in specimens from other organs, systems and tissues: Secondary | ICD-10-CM | POA: Insufficient documentation

## 2022-10-26 DIAGNOSIS — R509 Fever, unspecified: Secondary | ICD-10-CM | POA: Insufficient documentation

## 2022-10-26 DIAGNOSIS — R7881 Bacteremia: Secondary | ICD-10-CM

## 2022-10-26 LAB — CBC WITH DIFFERENTIAL/PLATELET
Abs Immature Granulocytes: 0.03 10*3/uL (ref 0.00–0.07)
Basophils Absolute: 0 10*3/uL (ref 0.0–0.1)
Basophils Relative: 0 %
Eosinophils Absolute: 0.1 10*3/uL (ref 0.0–0.5)
Eosinophils Relative: 1 %
HCT: 39.6 % (ref 39.0–52.0)
Hemoglobin: 12.8 g/dL — ABNORMAL LOW (ref 13.0–17.0)
Immature Granulocytes: 0 %
Lymphocytes Relative: 44 %
Lymphs Abs: 3 10*3/uL (ref 0.7–4.0)
MCH: 30.2 pg (ref 26.0–34.0)
MCHC: 32.3 g/dL (ref 30.0–36.0)
MCV: 93.4 fL (ref 80.0–100.0)
Monocytes Absolute: 1 10*3/uL (ref 0.1–1.0)
Monocytes Relative: 15 %
Neutro Abs: 2.7 10*3/uL (ref 1.7–7.7)
Neutrophils Relative %: 40 %
Platelets: 374 10*3/uL (ref 150–400)
RBC: 4.24 MIL/uL (ref 4.22–5.81)
RDW: 13.3 % (ref 11.5–15.5)
WBC: 6.8 10*3/uL (ref 4.0–10.5)
nRBC: 0 % (ref 0.0–0.2)

## 2022-10-26 LAB — COMPREHENSIVE METABOLIC PANEL
ALT: 82 U/L — ABNORMAL HIGH (ref 0–44)
AST: 68 U/L — ABNORMAL HIGH (ref 15–41)
Albumin: 3.6 g/dL (ref 3.5–5.0)
Alkaline Phosphatase: 55 U/L (ref 38–126)
Anion gap: 11 (ref 5–15)
BUN: 11 mg/dL (ref 6–20)
CO2: 24 mmol/L (ref 22–32)
Calcium: 9 mg/dL (ref 8.9–10.3)
Chloride: 101 mmol/L (ref 98–111)
Creatinine, Ser: 0.98 mg/dL (ref 0.61–1.24)
GFR, Estimated: >60 mL/min (ref >60)
Glucose, Bld: 96 mg/dL (ref 70–99)
Potassium: 3.6 mmol/L (ref 3.5–5.1)
Sodium: 136 mmol/L (ref 135–145)
Total Bilirubin: 0.3 mg/dL (ref 0.3–1.2)
Total Protein: 8 g/dL (ref 6.5–8.1)

## 2022-10-26 LAB — LACTIC ACID, PLASMA: Lactic Acid, Venous: 1.3 mmol/L (ref 0.5–1.9)

## 2022-10-26 LAB — CULTURE, BLOOD (ROUTINE X 2): Special Requests: ADEQUATE

## 2022-10-26 NOTE — ED Triage Notes (Addendum)
Pt was called to come in for + blood cultures. Pt states the past few days, "I don't feel like myself."

## 2022-10-26 NOTE — ED Notes (Signed)
This RN called pts name for a room; no answer. Another pt notified this RN that pt stated to her "ill be back, going to the bathroom" over an hour ago. This RN knocks on bathroom door.  Pt began yelling "what the fuck do you want. Someone is in here"; and does not open the door. This RN informed pt that it was his turn to be taken to a room in the back. Pt continues to talk to this RN while the door was closed. This RN provided the pt 8 more mins before knocking again stating security would be opening the door if the pt did not open it. Pt proceeds to sling the door open, and was seen putting belonging back into his bookbag. Pt started saying inappropriate things to this RN. Pt was escorted back to the assigned room

## 2022-10-26 NOTE — ED Provider Notes (Signed)
[name] Provider Note   CSN: [id number] Arrival date & time: 10/26/22  1843     History {Add pertinent medical, surgical, social history, OB history to HPI:1} Chief Complaint  Patient presents with   + blood cultures    Ronald Greene is a 27 y.o. male.  HPI   Patient with medical history including polysubstance use, syphilis, eczema presenting with positive blood cultures.  Patient states he currently has no issues at this time, he states prior to today he did have some associated fevers and chills, he also endorses some chest tightness and shortness of breath, he states that typically this comes on with exertion, but after he sits down it goes away, he just states that he feels his heart beating fast he currently has none.  He denies any back pain or leg swelling any other joint pain.  Patient states that last time he used heroin was few days ago, he states he been using his upper extremities, he also admits to methamphetamine use.  Patient states that he has not picked up the antibiotics that were prescribed to him due to financial issues.  I reviewed patient's chart has been seen on the 30th of last month the first and the second, presenting with left arm pain and swelling concern for possible cellulitis, versus DVT, it was recommended that patient gets admitted for further evaluation but patient left AMA, he was given a dose of Dalvance on the second and was discharged home with doxycycline.  Patient had blood cultures obtained on the first but appears that patient had left, patient had a positive and a negative viral, positive I will grew rods bacillus species.  Patient was seen earlier today but left without being assessed by a provider lab work including a CBC CMP lactic and new blood cultures were obtained all of which were unremarkable.  Home Medications Prior to Admission medications   Medication Sig Start Date End Date Taking?  Authorizing Provider  doxycycline (VIBRAMYCIN) 100 MG capsule Take 1 capsule (100 mg total) by mouth 2 (two) times daily. 09/15/22   Netta Corrigan, PA-C  emtricitabine-tenofovir AF (DESCOVY) 200-25 MG tablet Take 1 tablet by mouth daily. 10/23/21   Jennette Kettle, RPH-CPP  naloxone [facility]) nasal spray 4 mg/0.1 mL Take as directed 07/30/22   Horton, Clabe Seal, DO  sulfamethoxazole-trimethoprim (BACTRIM DS) 800-160 MG tablet Take 1 tablet by mouth 2 (two) times daily for 7 days. 10/21/22 10/28/22  Horton, Clabe Seal, DO      Allergies    Aloe    Review of Systems   Review of Systems  Constitutional:  Negative for chills and fever.  Respiratory:  Negative for shortness of breath.   Cardiovascular:  Negative for chest pain.  Gastrointestinal:  Negative for abdominal pain.  Neurological:  Negative for headaches.    Physical Exam Updated Vital Signs BP 125/72 (BP Location: Right Arm)   Pulse 82   Temp 98.6 F (37 C) (Oral)   Resp 16   Ht 6' (1.829 m)   Wt 83.9 kg   SpO2 100%   BMI 25.09 kg/m  Physical Exam Vitals and nursing note reviewed.  Constitutional:      General: He is not in acute distress.    Appearance: He is not ill-appearing.  HENT:     Head: Normocephalic and atraumatic.     Nose: No congestion.  Eyes:     Conjunctiva/sclera: Conjunctivae normal.  Cardiovascular:  Rate and Rhythm: Normal rate and regular rhythm.     Pulses: Normal pulses.     Heart sounds: No murmur heard.    No friction rub. No gallop.  Pulmonary:     Effort: No respiratory distress.     Breath sounds: No wheezing, rhonchi or rales.  Musculoskeletal:     Right lower leg: No edema.     Left lower leg: No edema.     Comments: No unilateral leg swelling no calf tenderness no palpable cords, spine was palpated nontender to palpation, there is no noted joint erythema or tenderness.  Patient has a small abrasion noted on his left forearm, appears to be healing well no fluctuance induration present  no erythema or rash tracking up the arm.  Skin:    General: Skin is warm and dry.  Neurological:     Mental Status: He is alert.  Psychiatric:        Mood and Affect: Mood normal.     ED Results / Procedures / Treatments   Labs (all labs ordered are listed, but only abnormal results are displayed) Labs Reviewed - No data to display  EKG None  Radiology No results found.  Procedures Procedures  {Document cardiac monitor, telemetry assessment procedure when appropriate:1}  Medications Ordered in ED Medications - No data to display  ED Course/ Medical Decision Making/ A&P   {   Click here for ABCD2, HEART and other calculatorsREFRESH Note before signing :1}                          Medical Decision Making  This patient presents to the ED for concern of positive blood cultures, this involves an extensive number of treatment options, and is a complaint that carries with it a high risk of complications and morbidity.  The differential diagnosis includes bacteremia, false positive, endocarditis, septic arthritis, osteomyelitis    Additional history obtained:  Additional history obtained from N/A External records from outside source obtained and reviewed including recent ER notes   Co morbidities that complicate the patient evaluation  Polysubstance use  Social Determinants of Health:  Housing stability    Lab Tests:  I Ordered, and personally interpreted labs.  The pertinent results include:  ***   Imaging Studies ordered:  I ordered imaging studies including ***  I independently visualized and interpreted imaging which showed *** I agree with the radiologist interpretation   Cardiac Monitoring:  The patient was maintained on a cardiac monitor.  I personally viewed and interpreted the cardiac monitored which showed an underlying rhythm of: ***   Medicines ordered and prescription drug management:  I ordered medication including ***  for ***  I have  reviewed the patients home medicines and have made adjustments as needed  Critical Interventions:  ***   Reevaluation:  After the interventions noted above, I reevaluated the patient and found that they have :{resolved/improved/worsened:23923::"improved"}  Consultations Obtained:  I requested consultation with the ***,  and discussed lab and imaging findings as well as pertinent plan - they recommend: ***    Test Considered:  ***    Rule out ****    Dispostion and problem list  After consideration of the diagnostic results and the patients response to treatment, I feel that the patent would benefit from ***.       {Document critical care time when appropriate:1} {Document review of labs and clinical decision tools ie heart score, Chads2Vasc2 etc:1}  {Document  your independent review of radiology images, and any outside records:1} {Document your discussion with family members, caretakers, and with consultants:1} {Document social determinants of health affecting pt's care:1} {Document your decision making why or why not admission, treatments were needed:1} Final Clinical Impression(s) / ED Diagnoses Final diagnoses:  None    Rx / DC Orders ED Discharge Orders     None

## 2022-10-26 NOTE — ED Triage Notes (Signed)
Pt returns with hx of IV drug abuse. Pt called yesterday and told to return to the hospital because of positive blood culture. Pt reports subjective fever at home. PT awake, alert, appropriate, oriented x4. VSS

## 2022-10-26 NOTE — ED Notes (Signed)
Pt requested to not have 2nd set of blood cultures drawn at this time.

## 2022-10-26 NOTE — ED Provider Triage Note (Signed)
Emergency Medicine Provider Triage Evaluation Note  Oryon Bruggeman , a 27 y.o. male  was evaluated in triage.  Pt complains of receiving a call for abnormal blood work.  He was seen earlier as well and had blood work as well as repeat cultures drawn.  States he is ready to get treatment.  Does not want blood work redrawn since it was just done this morning.  Review of Systems  Positive: As above Negative: As above  Physical Exam  BP 117/85   Pulse 87   Temp 98.6 F (37 C) (Oral)   Resp 16   Ht 6' (1.829 m)   Wt 83.9 kg   SpO2 100%   BMI 25.09 kg/m  Gen:   Awake, no distress   Resp:  Normal effort  MSK:   Moves extremities without difficulty  Other:    Medical Decision Making  Medically screening exam initiated at 7:14 PM.  Appropriate orders placed.  Lavell Mediate was informed that the remainder of the evaluation will be completed by another provider, this initial triage assessment does not replace that evaluation, and the importance of remaining in the ED until their evaluation is complete.     Marita Kansas, PA-C 10/26/22 [phone]

## 2022-10-27 ENCOUNTER — Telehealth (HOSPITAL_BASED_OUTPATIENT_CLINIC_OR_DEPARTMENT_OTHER): Payer: Self-pay | Admitting: *Deleted

## 2022-10-27 ENCOUNTER — Emergency Department (HOSPITAL_COMMUNITY)
Admission: EM | Admit: 2022-10-27 | Discharge: 2022-10-27 | Disposition: A | Discharge status: Home / Self Care | Payer: Medicaid Other | Attending: Emergency Medicine | Admitting: Emergency Medicine

## 2022-10-27 DIAGNOSIS — T40601A Poisoning by unspecified narcotics, accidental (unintentional), initial encounter: Secondary | ICD-10-CM | POA: Diagnosis present

## 2022-10-27 LAB — CULTURE, BLOOD (ROUTINE X 2): Culture: NO GROWTH

## 2022-10-27 NOTE — Discharge Instructions (Signed)
We have redrawn blood to determine if you have a blood infection, you can take 1 to 3 days for results, if they are positive the hospital will call you for further evaluation.  I do want you to follow-up with infectious disease I have given you their contact information.  Come back to the emergency department if you develop chest pain, shortness of breath, severe abdominal pain, uncontrolled nausea, vomiting, diarrhea fever chills.

## 2022-10-27 NOTE — ED Triage Notes (Signed)
Pt returns back to ED via [facility] after being found down asleep outside of [facility] after reported heroin use. No narcan used en route per EMS

## 2022-10-27 NOTE — ED Provider Notes (Signed)
[place] EMERGENCY DEPARTMENT AT [facility] Provider Note   CSN: [id number] Arrival date & time: 10/27/22  4098     History  Chief Complaint  Patient presents with   Drug Problem    Ronald Greene is a 27 y.o. male.  27 year old male with a history of IV opiate abuse who presents to the emergency department after being found by EMS.  Patient is drowsy and reports that he did use some heroin that he injected into his arm last night.  Says it was an attempt to get high and denies any thoughts of self-harm.  No trauma.  EMS did not give any Narcan.  Denies any additional substance or alcohol use.  Has been seen several times in the emergency department for left arm pain and diagnosed with cellulitis.  Was ordered Dalvance but left AMA prior to receiving it.  Was given Bactrim on 10/21/2022 as well.  Did have a blood culture that was positive for bacillus but this was discussed with infectious disease and is unclear if this is a true positive or not.  They will see him in clinic.       Home Medications Prior to Admission medications   Medication Sig Start Date End Date Taking? Authorizing Provider  doxycycline (VIBRAMYCIN) 100 MG capsule Take 1 capsule (100 mg total) by mouth 2 (two) times daily. 09/15/22   Netta Corrigan, PA-C  emtricitabine-tenofovir AF (DESCOVY) 200-25 MG tablet Take 1 tablet by mouth daily. 10/23/21   Jennette Kettle, RPH-CPP  naloxone [facility]) nasal spray 4 mg/0.1 mL Take as directed 07/30/22   Horton, Clabe Seal, DO  sulfamethoxazole-trimethoprim (BACTRIM DS) 800-160 MG tablet Take 1 tablet by mouth 2 (two) times daily for 7 days. 10/21/22 10/28/22  Horton, Clabe Seal, DO      Allergies    Aloe    Review of Systems   Review of Systems  Physical Exam Updated Vital Signs BP 106/73   Pulse 90   Temp 97.7 F (36.5 C) (Oral)   Resp 14   SpO2 93%  Physical Exam Vitals and nursing note reviewed.  Constitutional:      General: He is not in acute  distress.    Appearance: He is well-developed.     Comments: Drowsy but easily arousable  HENT:     Head: Normocephalic and atraumatic.     Right Ear: External ear normal.     Left Ear: External ear normal.     Nose: Nose normal.  Eyes:     Extraocular Movements: Extraocular movements intact.     Conjunctiva/sclera: Conjunctivae normal.     Pupils: Pupils are equal, round, and reactive to light.     Comments: Pupils 4 mm bilaterally  Pulmonary:     Effort: Pulmonary effort is normal. No respiratory distress.  Musculoskeletal:     Cervical back: Normal range of motion and neck supple.     Right lower leg: No edema.     Left lower leg: No edema.     Comments: Left upper extremity with several healing crusted lesions.  Left medial forearm with small amount of erythema is approximately 4 cm in diameter.  No fluctuance noted.  Skin:    General: Skin is warm and dry.  Neurological:     Mental Status: He is alert and oriented to person, place, and time. Mental status is at baseline.     Comments: Moving all 4 extremities.  Cranial nerves grossly intact.  Psychiatric:  Mood and Affect: Mood normal.        Behavior: Behavior normal.     ED Results / Procedures / Treatments   Labs (all labs ordered are listed, but only abnormal results are displayed) Labs Reviewed - No data to display  EKG EKG Interpretation  Date/Time:  Wednesday October 27 2022 07:13:03 EDT Ventricular Rate:  93 PR Interval:  149 QRS Duration: 80 QT Interval:  340 QTC Calculation: 423 R Axis:   65 Text Interpretation: Sinus rhythm Confirmed by Vonita Moss [phone]) on 10/27/2022 8:34:55 AM  Radiology No results found.  Procedures Procedures    Medications Ordered in ED Medications - No data to display  ED Course/ Medical Decision Making/ A&P                             Medical Decision Making  Ronald Greene is a 27 y.o. male with comorbidities that complicate the patient evaluation  including IV drug use who presents emergency department after suspected heroin overdose  Initial Ddx:  Heroin overdose, trauma, cellulitis  MDM:  Patient admitted to using heroin prior to arrival and suspect that that was the cause of his drowsiness.  Since he did not receive Narcan fairly soon as he is clinically sober he is able to be discharged.  No signs of trauma on exam and patient denies this.  Has a small area of cellulitis but is nonseptic at this time.  Is already been prescribed Bactrim so we will have him get that prescription and follow-up with infectious disease in clinic regarding his blood cultures.  Plan:  Observe serial sobriety  ED Summary/Re-evaluation:  Patient was able to clinically sober and was discharged home with instructions to pick up his Bactrim.  This patient presents to the ED for concern of complaints listed in HPI, this involves an extensive number of treatment options, and is a complaint that carries with it a high risk of complications and morbidity. Disposition including potential need for admission considered.   Dispo: DC Home. Return precautions discussed including, but not limited to, those listed in the AVS. Allowed pt time to ask questions which were answered fully prior to dc.  Additional history obtained from EMS Records reviewed ED Visit Notes I personally reviewed and interpreted the pt's EKG: see above for interpretation  I have reviewed the patients home medications and made adjustments as needed Social Determinants of health:  Substance abuse         Final Clinical Impression(s) / ED Diagnoses Final diagnoses:  Opiate overdose, accidental or unintentional, initial encounter [facility])    Rx / DC Orders ED Discharge Orders     None         Rondel Baton, MD 10/27/22 [phone]

## 2022-10-27 NOTE — Telephone Encounter (Signed)
Post ED Visit - Positive Culture Follow-up  Culture report reviewed by antimicrobial stewardship pharmacist: [facility] [address] [place].D. []  Celedonio Miyamoto, Pharm.D., BCPS AQ-ID []  Garvin Fila, Pharm.D., BCPS []  Georgina Pillion, Pharm.D., BCPS []  [place], [address].D., BCPS, AAHIVP []  Estella Husk, Pharm.D., BCPS, AAHIVP []  Lysle Pearl, PharmD, BCPS []  Phillips Climes, PharmD, BCPS []  Agapito Games, PharmD, BCPS []  Verlan Friends, PharmD []  Mervyn Gay, PharmD, BCPS []  Vinnie Level, PharmD  [facility] []  Len Childs, PharmD []  Greer Pickerel, PharmD []  Adalberto Cole, PharmD []  Perlie Gold, Rph []  Lonell Face) Jean Rosenthal, PharmD []  Earl Many, PharmD []  Junita Push, PharmD []  Dorna Leitz, PharmD []  Terrilee Files, PharmD []  Lynann Beaver, PharmD []  Keturah Barre, PharmD []  Loralee Pacas, PharmD []  Bernadene Person, PharmD   Positive blood culture Treated with no abx, culture repeated and remains negative Bing Quarry 10/27/2022, 10:43 AM

## 2022-10-27 NOTE — Discharge Instructions (Addendum)
You were seen for your opiate overdose in the emergency department.   At home, please take the antibiotic (Bactrim) that you were prescribed for your arm infection.    Follow-up with the infectious disease clinic in 2-3 days regarding your visit.    Return immediately to the emergency department if you experience any of the following: Fevers, worsening pain or swelling of your arm, or any other concerning symptoms.    Thank you for visiting our Emergency Department. It was a pleasure taking care of you today.

## 2022-10-29 ENCOUNTER — Emergency Department (HOSPITAL_COMMUNITY)
Admission: EM | Admit: 2022-10-29 | Discharge: 2022-10-29 | Disposition: A | Discharge status: Home / Self Care | Payer: Medicaid Other

## 2022-10-31 LAB — CULTURE, BLOOD (ROUTINE X 2)
Culture: NO GROWTH
Special Requests: ADEQUATE

## 2023-02-21 DIAGNOSIS — L03211 Cellulitis of face: Secondary | ICD-10-CM | POA: Diagnosis present

## 2023-02-22 ENCOUNTER — Encounter (HOSPITAL_COMMUNITY): Payer: Self-pay

## 2023-02-22 ENCOUNTER — Other Ambulatory Visit: Payer: Self-pay

## 2023-02-22 ENCOUNTER — Emergency Department (HOSPITAL_COMMUNITY)
Admission: EM | Admit: 2023-02-22 | Discharge: 2023-02-22 | Disposition: A | Discharge status: Home / Self Care | Payer: MEDICAID | Attending: Emergency Medicine | Admitting: Emergency Medicine

## 2023-02-22 DIAGNOSIS — L03211 Cellulitis of face: Secondary | ICD-10-CM

## 2023-02-22 MED ORDER — DOXYCYCLINE HYCLATE 100 MG PO CAPS: 100.0000 mg | ORAL_CAPSULE | Freq: Two times a day (BID) | ORAL | 0 refills | Status: DC

## 2023-02-22 MED ORDER — DOXYCYCLINE HYCLATE 100 MG PO TABS
100.0000 mg | ORAL_TABLET | Freq: Once | ORAL | Status: AC
  Administered 2023-02-22: 100 mg via ORAL
  Filled 2023-02-22: qty 1

## 2023-02-22 NOTE — ED Triage Notes (Addendum)
Pt coming in via POV with c/o spider bite on his face that he states happened about two days ago. He states he was walking [place] a trail in the woods when a small spider bit him. Pt states he's been putting an OTC ointment on the bite. Pt has visible small raised bump on face. He states he's been trying to squeeze it to help with swelling and describes it as a burning pain. Denies chest pain, difficulty breathing, n/v/d.

## 2023-02-22 NOTE — ED Provider Notes (Signed)
[place] EMERGENCY DEPARTMENT AT [facility] Provider Note   CSN: [id number] Arrival date & time: 02/21/23  2357     History  Chief Complaint  Patient presents with   Insect Bite    Ronald Greene is a 27 y.o. male.  HPI     This is a 27 year old male who presents with concern for spider bite to the left face.  Patient reports 2 to 3 days ago he was walking through the woods through multiple spider webs.  He believes he was bit on the face.  He has had a bump since that time.  He has been able to express some drainage from the bump.  He has noted some swelling.  No fevers or systemic symptoms. Home Medications Prior to Admission medications   Medication Sig Start Date End Date Taking? Authorizing Provider  doxycycline (VIBRAMYCIN) 100 MG capsule Take 1 capsule (100 mg total) by mouth 2 (two) times daily. 02/22/23  Yes Terril Chestnut, Mayer Masker, MD  emtricitabine-tenofovir AF (DESCOVY) 200-25 MG tablet Take 1 tablet by mouth daily. 10/23/21   Jennette Kettle, RPH-CPP  naloxone [facility]) nasal spray 4 mg/0.1 mL Take as directed 07/30/22   Wallace Gappa, Clabe Seal, DO      Allergies    Aloe    Review of Systems   Review of Systems  Constitutional:  Negative for fever.  Skin:  Positive for color change.  All other systems reviewed and are negative.   Physical Exam Updated Vital Signs BP (!) 140/92 (BP Location: Right Arm)   Pulse 87   Temp 98 F (36.7 C) (Oral)   Resp 18   Ht 1.829 m (6')   Wt 88.5 kg   SpO2 96%   BMI 26.45 kg/m  Physical Exam Vitals and nursing note reviewed.  Constitutional:      Appearance: He is well-developed. He is not ill-appearing.  HENT:     Head: Normocephalic and atraumatic.     Comments: Small papule/pustule over the left cheek, no significant fluctuance or induration, no spontaneous drainage noted, mild adjacent erythema, no significant facial swelling Eyes:     Pupils: Pupils are equal, round, and reactive to light.  Cardiovascular:      Rate and Rhythm: Normal rate and regular rhythm.  Pulmonary:     Effort: Pulmonary effort is normal. No respiratory distress.  Abdominal:     Palpations: Abdomen is soft.  Musculoskeletal:     Cervical back: Neck supple.  Lymphadenopathy:     Cervical: No cervical adenopathy.  Skin:    General: Skin is warm and dry.  Neurological:     Mental Status: He is alert and oriented to person, place, and time.  Psychiatric:        Mood and Affect: Mood normal.     ED Results / Procedures / Treatments   Labs (all labs ordered are listed, but only abnormal results are displayed) Labs Reviewed - No data to display  EKG None  Radiology No results found.  Procedures Procedures    Medications Ordered in ED Medications  doxycycline (VIBRA-TABS) tablet 100 mg (has no administration in time range)    ED Course/ Medical Decision Making/ A&P                                 Medical Decision Making Risk Prescription drug management.   This patient presents to the ED for concern of skin lesion,  this involves an extensive number of treatment options, and is a complaint that carries with it a high risk of complications and morbidity.  I considered the following differential and admission for this acute, potentially life threatening condition.  The differential diagnosis includes bite, abscess, pustule, cellulitis  MDM:    This is a 27 year old male who presents with concern for possible bite to the left cheek.  He is nontoxic and vital signs are reassuring.  He has a small pustule/papule to the left face without spontaneous drainage.  No fluctuance.  Do not feel it needs incision and drainage.  There is slight erythema adjacent without significant facial swelling.  Will treat for cellulitis.  Patient will be given Doxy.  He was given return precautions.  (Labs, imaging, consults)  Labs: I Ordered, and personally interpreted labs.  The pertinent results include: None  Imaging Studies  ordered: I ordered imaging studies including none I independently visualized and interpreted imaging. I agree with the radiologist interpretation  Additional history obtained from chart review.  External records from outside source obtained and reviewed including prior evaluations  Cardiac Monitoring: The patient was not maintained on a cardiac monitor.  If on the cardiac monitor, I personally viewed and interpreted the cardiac monitored which showed an underlying rhythm of: N/A  Reevaluation: After the interventions noted above, I reevaluated the patient and found that they have :stayed the same  Social Determinants of Health:  lives independently  Disposition: Discharge  Co morbidities that complicate the patient evaluation  Past Medical History:  Diagnosis Date   Eczema    Heroin abuse (HCC)    Syphilis      Medicines Meds ordered this encounter  Medications   doxycycline (VIBRA-TABS) tablet 100 mg   doxycycline (VIBRAMYCIN) 100 MG capsule    Sig: Take 1 capsule (100 mg total) by mouth 2 (two) times daily.    Dispense:  20 capsule    Refill:  0    I have reviewed the patients home medicines and have made adjustments as needed  Problem List / ED Course: Problem List Items Addressed This Visit   None Visit Diagnoses     Cellulitis of face    -  Primary                   Final Clinical Impression(s) / ED Diagnoses Final diagnoses:  Cellulitis of face    Rx / DC Orders ED Discharge Orders          Ordered    doxycycline (VIBRAMYCIN) 100 MG capsule  2 times daily        02/22/23 0607              Shon Baton, MD 02/22/23 [phone]

## 2023-02-22 NOTE — Discharge Instructions (Addendum)
You were seen today for wound to the left face.  Could be a pimple versus a bite.  Regardless it does appear infected.  Will treat you for cellulitis with doxycycline.  If you develop fevers or any systemic symptoms, you should be reevaluated.

## 2023-02-22 NOTE — ED Notes (Addendum)
Pt outside so I moved him back to the [facility]

## 2023-02-25 ENCOUNTER — Encounter (HOSPITAL_COMMUNITY): Payer: Self-pay | Admitting: Emergency Medicine

## 2023-02-25 ENCOUNTER — Other Ambulatory Visit: Payer: Self-pay

## 2023-02-25 ENCOUNTER — Emergency Department (HOSPITAL_COMMUNITY)
Admission: EM | Admit: 2023-02-25 | Discharge: 2023-02-25 | Disposition: A | Discharge status: Home / Self Care | Payer: MEDICAID

## 2023-02-25 ENCOUNTER — Emergency Department (HOSPITAL_COMMUNITY): Payer: MEDICAID

## 2023-02-25 DIAGNOSIS — T50901A Poisoning by unspecified drugs, medicaments and biological substances, accidental (unintentional), initial encounter: Secondary | ICD-10-CM | POA: Insufficient documentation

## 2023-02-25 NOTE — ED Triage Notes (Signed)
Pt BIB EMS from hotel room, found unresponsive breathing 4 RPM eyes rolled back and lethargic. Bystander gave 1 mg narcan. EMS picked up backpack and asked if any weapons. Pt jumped up, took backpack in room and staggered back to stretcher. Denies any drug use or alcohol. Several needles noted inside hotel room.   BP 136/78 P 100 RR 16 SpO2 95% CBG 152

## 2023-02-25 NOTE — ED Provider Notes (Signed)
[place] EMERGENCY DEPARTMENT AT [facility] Provider Note   CSN: [id number] Arrival date & time: 02/25/23  1543     History  Chief Complaint  Patient presents with   Drug Overdose    Ronald Greene is a 27 y.o. male.  27 year old male with past medical history of polysubstance abuse in the past presents emergency department today after an accidental overdose.  The patient states that he snorted what he thought was heroin prior to arrival.  He apparently was unresponsive and medics gave him Narcan.  He subsequently brought to the ER for further evaluation.  Denies any complaints at this time.  He denies any suicidal or homicidal ideations.   Drug Overdose       Home Medications Prior to Admission medications   Medication Sig Start Date End Date Taking? Authorizing Provider  doxycycline (VIBRAMYCIN) 100 MG capsule Take 1 capsule (100 mg total) by mouth 2 (two) times daily. 02/22/23   Horton, Mayer Masker, MD  emtricitabine-tenofovir AF (DESCOVY) 200-25 MG tablet Take 1 tablet by mouth daily. 10/23/21   Jennette Kettle, RPH-CPP  naloxone [facility]) nasal spray 4 mg/0.1 mL Take as directed 07/30/22   Horton, Clabe Seal, DO      Allergies    Aloe    Review of Systems   Review of Systems Gen: No fevers Eyes: No vision changes HEENT: no congestion, sore throat Neck: no neck stiffness Resp: no cough, shortness of breath Card: no chest pain Abd: no nausea or vomiting, no abdominal pain Extremities: no leg swelling Neuro: no weakness, numbness, tingling Skin: no rashes  Physical Exam Updated Vital Signs BP 125/76 (BP Location: Right Arm)   Pulse 97   Temp 98.2 F (36.8 C) (Oral)   Resp 19   SpO2 92%  Physical Exam Gen: NAD Eyes: PERRL, EOMI HEENT: no oropharyngeal swelling Neck: trachea midline Resp: clear to auscultation bilaterally Card: RRR, no murmurs, rubs, or gallops Abd: nontender, nondistended Extremities: no calf tenderness, no edema Vascular: 2+  radial pulses bilaterally, 2+ DP pulses bilaterally Skin: no rashes Psyc: acting appropriately  ED Results / Procedures / Treatments   Labs (all labs ordered are listed, but only abnormal results are displayed) Labs Reviewed - No data to display  EKG None  Radiology No results found.  Procedures Procedures    Medications Ordered in ED Medications - No data to display  ED Course/ Medical Decision Making/ A&P                                 Medical Decision Making 27 year old male with past medical history of polysubstance abuse presenting to the emergency department today with unintentional overdose.  The patient is alert here.  His pulse ox is 90 to 93%.  Will obtain x-ray to evaluate for Narcan due to pulmonary edema.  Will observe the patient here for the need for further Narcan.  I will reevaluate for ultimate disposition but suspect discharge.  The patient is awake and alert here.  He eloped from the emergency department prior to reevaluation.  I did explain to the patient initially that we would need to observe him for 1-1/2 to 2 hours to ensure that the Narcan did not wear off.  The patient did express understanding and initially was amenable to staying.  He was not altered during my initial evaluation.           Final Clinical Impression(s) /  ED Diagnoses Final diagnoses:  Accidental overdose, initial encounter    Rx / DC Orders ED Discharge Orders     None         Durwin Glaze, MD 02/25/23 2350

## 2023-03-16 IMAGING — CT CT ANGIO CHEST
2 of 6 series · 18 of 36 positions shown · IV contrast (omnipaque)
Comparison: Portable chest 02/01/2021.

CLINICAL DATA: 25-year-old male found unresponsive after overdose.
Apnea. Abnormal D-dimer.

EXAM:
CT ANGIOGRAPHY CHEST WITH CONTRAST
TECHNIQUE: Multidetector CT imaging of the chest was performed using the
standard protocol during bolus administration of intravenous
contrast. Multiplanar CT image reconstructions and MIPs were
obtained to evaluate the vascular anatomy.
CONTRAST:  80mL OMNIPAQUE IOHEXOL 350 MG/ML SOLN

[Series 5: thins · axial · 0.68mm/px · z∈[-300,-56]mm · 17 of 276 slices shown]
[im 16/276  lung]
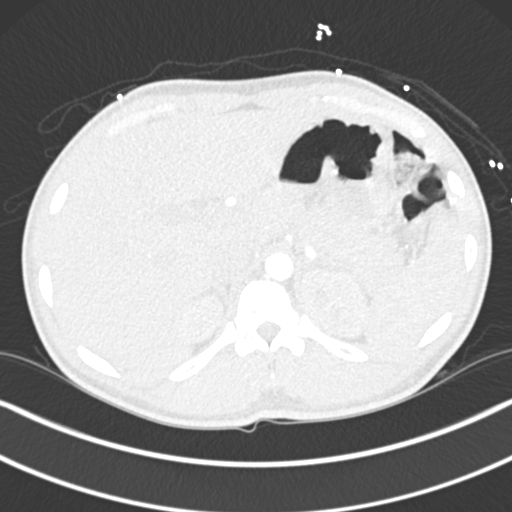
[im 31/276  mediastinal]
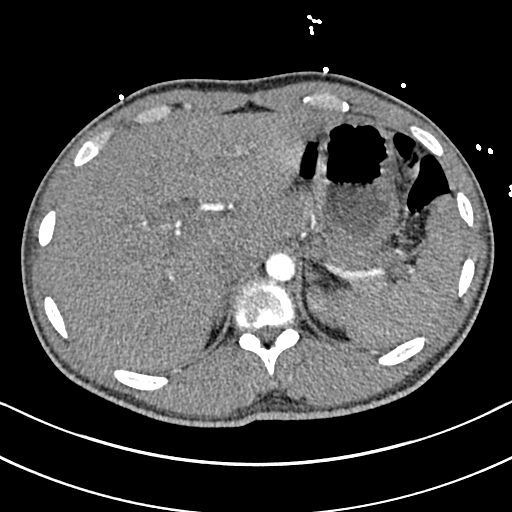
[im 46/276  lung]
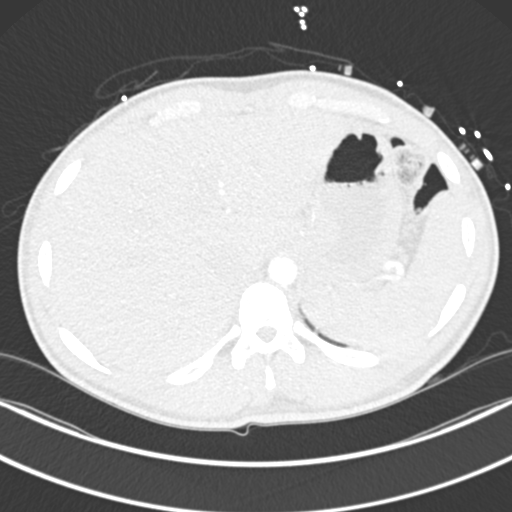
[im 62/276  mediastinal]
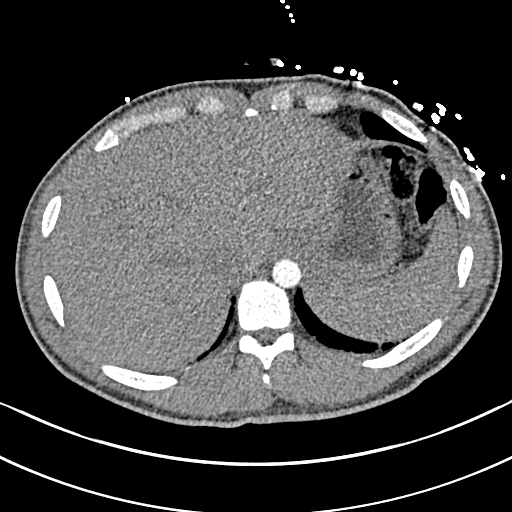
[im 77/276  lung]
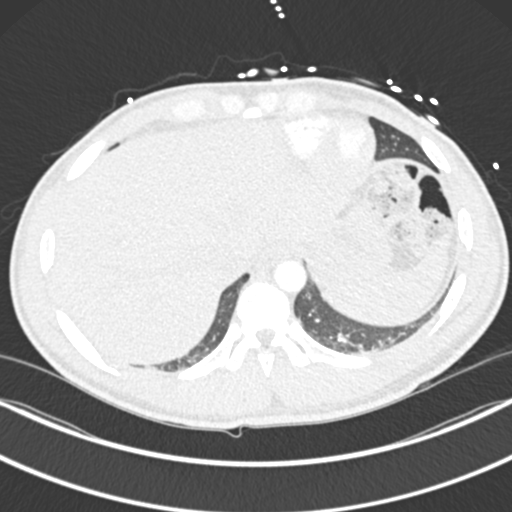
[im 92/276  mediastinal]
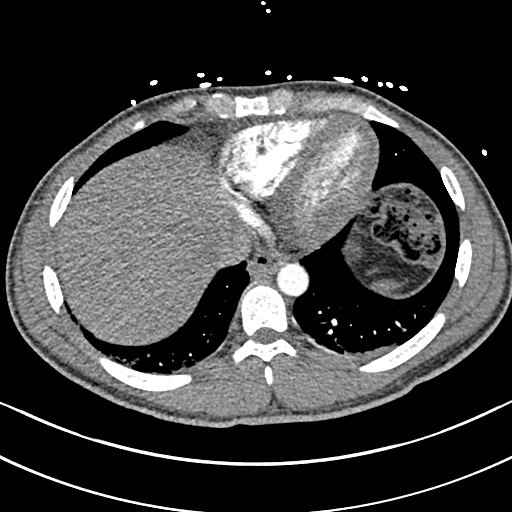
[im 107/276  lung]
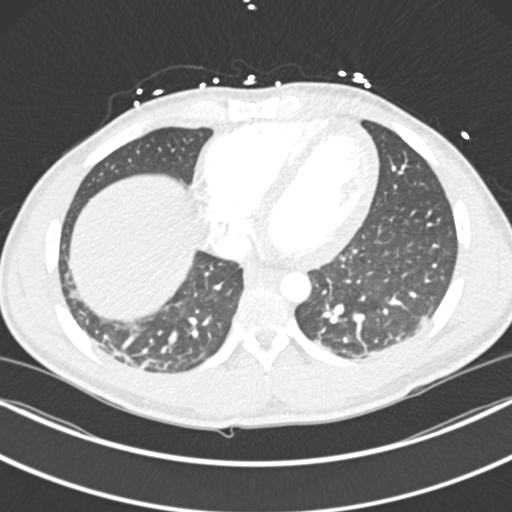
[im 123/276  mediastinal]
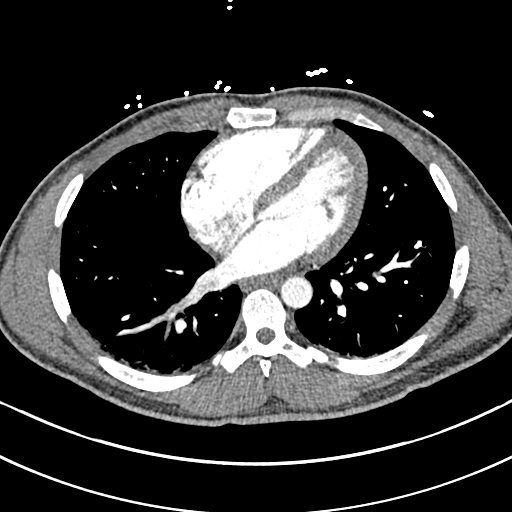
[im 138/276  lung]
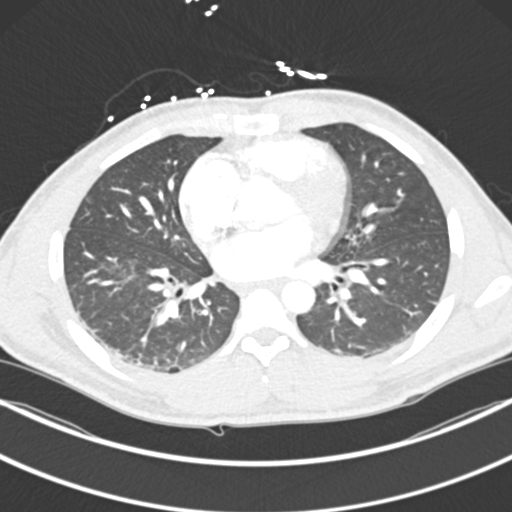
[im 153/276  mediastinal]
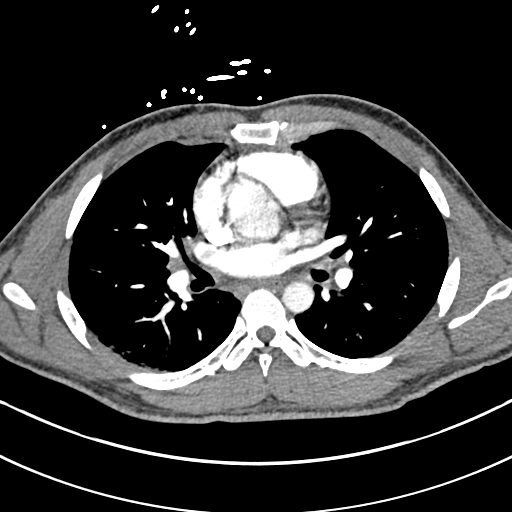
[im 169/276  lung]
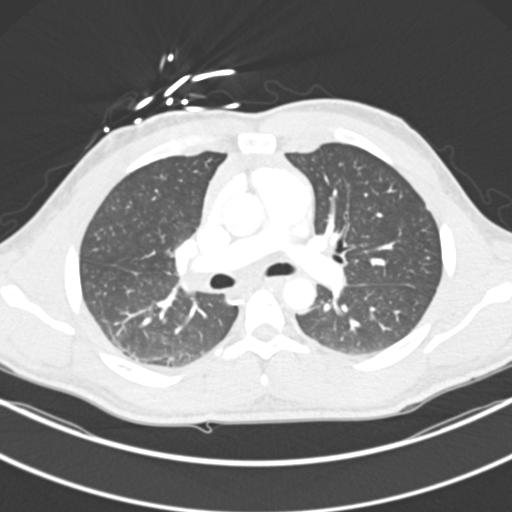
[im 184/276  mediastinal]
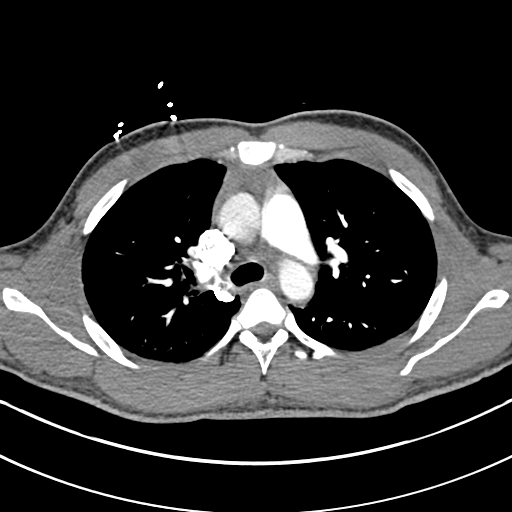
[im 199/276  lung]
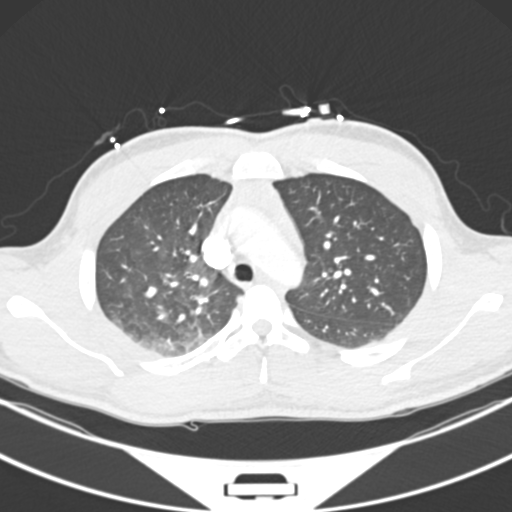
[im 214/276  mediastinal]
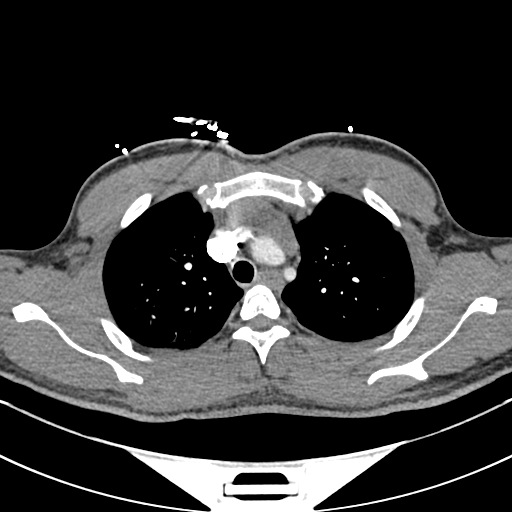
[im 230/276  lung]
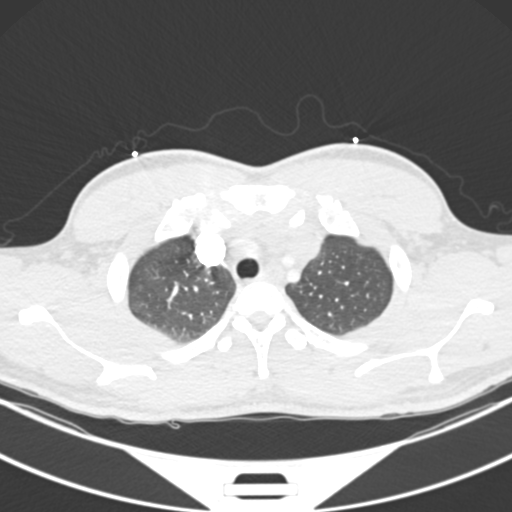
[im 245/276  mediastinal]
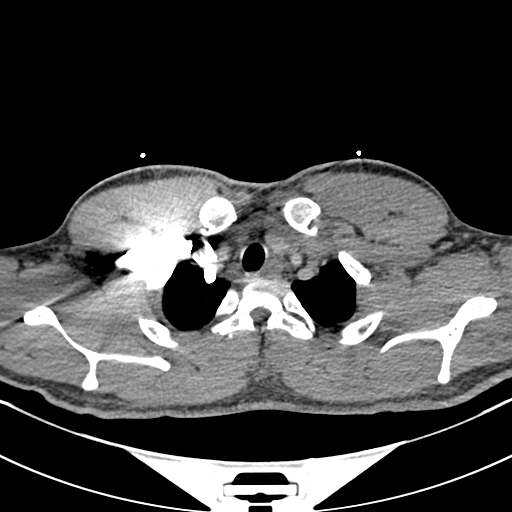
[im 260/276  lung]
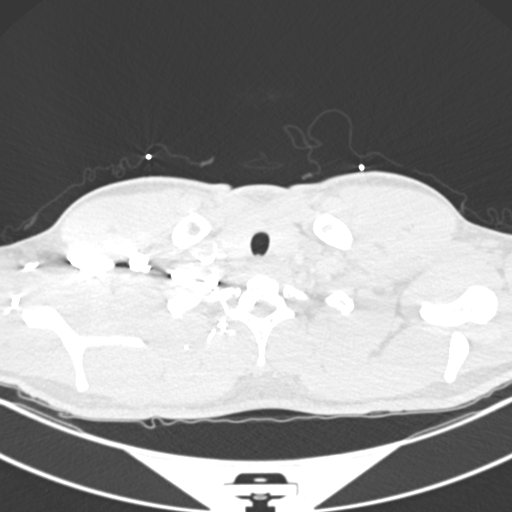

[Series 7: coronal mpr · coronal · 0.58mm/px · 1 of 137 slices shown]
[im 69/137  mediastinal]
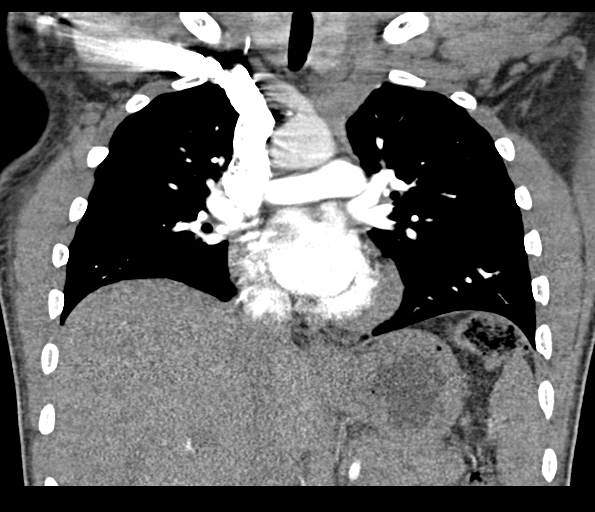

[18 of 36 positions shown; findings below may reference images not displayed]

FINDINGS: Cardiovascular: Good contrast bolus timing in the pulmonary arterial
tree.

No focal filling defect identified in the pulmonary arteries to
suggest acute pulmonary embolism.

Little contrast in the aorta which appears negative. Cardiac size
within normal limits. No pericardial effusion.

Mediastinum/Nodes: Residual thymus appears within normal limits. No
mediastinal mass or lymphadenopathy.

Lungs/Pleura: Major airways are patent. Mildly asymmetric dependent
opacity in the lungs, with additional mostly dependent but also
scattered peribronchial ground-glass opacity in the right upper
lobe. No consolidation. No pleural effusion.

Upper Abdomen: Negative visible liver, spleen, pancreas, adrenal
glands, kidneys and bowel in the upper abdomen.

Musculoskeletal: Negative.

Review of the MIP images confirms the above findings.
IMPRESSION: 1. Negative for acute pulmonary embolus.
2. Mild atelectasis. Additional peribronchial and dependent
ground-glass opacity raises the possibility of minor pulmonary
edema. Developing infection felt less likely. No pleural effusion.

## 2023-04-15 ENCOUNTER — Emergency Department (HOSPITAL_COMMUNITY)
Admission: EM | Admit: 2023-04-15 | Discharge: 2023-04-15 | Disposition: A | Discharge status: Home / Self Care | Payer: MEDICAID | Attending: Emergency Medicine | Admitting: Emergency Medicine

## 2023-04-15 ENCOUNTER — Emergency Department (HOSPITAL_COMMUNITY): Payer: MEDICAID

## 2023-04-15 ENCOUNTER — Encounter (HOSPITAL_COMMUNITY): Payer: Self-pay | Admitting: Emergency Medicine

## 2023-04-15 ENCOUNTER — Other Ambulatory Visit: Payer: Self-pay

## 2023-04-15 DIAGNOSIS — L03113 Cellulitis of right upper limb: Secondary | ICD-10-CM | POA: Diagnosis not present

## 2023-04-15 DIAGNOSIS — Y900 Blood alcohol level of less than 20 mg/100 ml: Secondary | ICD-10-CM | POA: Insufficient documentation

## 2023-04-15 DIAGNOSIS — D72829 Elevated white blood cell count, unspecified: Secondary | ICD-10-CM | POA: Diagnosis not present

## 2023-04-15 DIAGNOSIS — R700 Elevated erythrocyte sedimentation rate: Secondary | ICD-10-CM | POA: Diagnosis not present

## 2023-04-15 DIAGNOSIS — F1510 Other stimulant abuse, uncomplicated: Secondary | ICD-10-CM | POA: Insufficient documentation

## 2023-04-15 DIAGNOSIS — M7989 Other specified soft tissue disorders: Secondary | ICD-10-CM | POA: Diagnosis present

## 2023-04-15 DIAGNOSIS — F1110 Opioid abuse, uncomplicated: Secondary | ICD-10-CM | POA: Insufficient documentation

## 2023-04-15 DIAGNOSIS — R000 Tachycardia, unspecified: Secondary | ICD-10-CM | POA: Diagnosis not present

## 2023-04-15 LAB — COMPREHENSIVE METABOLIC PANEL
ALT: 115 U/L — ABNORMAL HIGH (ref 0–44)
AST: 67 U/L — ABNORMAL HIGH (ref 15–41)
Albumin: 3.8 g/dL (ref 3.5–5.0)
Alkaline Phosphatase: 58 U/L (ref 38–126)
Anion gap: 9 (ref 5–15)
BUN: 17 mg/dL (ref 6–20)
CO2: 26 mmol/L (ref 22–32)
Calcium: 9 mg/dL (ref 8.9–10.3)
Chloride: 102 mmol/L (ref 98–111)
Creatinine, Ser: 0.93 mg/dL (ref 0.61–1.24)
GFR, Estimated: >60 mL/min (ref >60)
Glucose, Bld: 108 mg/dL — ABNORMAL HIGH (ref 70–99)
Potassium: 3.8 mmol/L (ref 3.5–5.1)
Sodium: 137 mmol/L (ref 135–145)
Total Bilirubin: 0.4 mg/dL (ref <1.2)
Total Protein: 7.9 g/dL (ref 6.5–8.1)

## 2023-04-15 LAB — BLOOD CULTURE ID PANEL (REFLEXED) - BCID2

## 2023-04-15 LAB — CBC WITH DIFFERENTIAL/PLATELET
Abs Immature Granulocytes: 0.03 10*3/uL (ref 0.00–0.07)
Basophils Absolute: 0 10*3/uL (ref 0.0–0.1)
Basophils Relative: 0 %
Eosinophils Absolute: 0.1 10*3/uL (ref 0.0–0.5)
Eosinophils Relative: 1 %
HCT: 41.9 % (ref 39.0–52.0)
Hemoglobin: 13.4 g/dL (ref 13.0–17.0)
Immature Granulocytes: 0 %
Lymphocytes Relative: 40 %
Lymphs Abs: 4.5 10*3/uL — ABNORMAL HIGH (ref 0.7–4.0)
MCH: 30.7 pg (ref 26.0–34.0)
MCHC: 32 g/dL (ref 30.0–36.0)
MCV: 96.1 fL (ref 80.0–100.0)
Monocytes Absolute: 1.3 10*3/uL — ABNORMAL HIGH (ref 0.1–1.0)
Monocytes Relative: 12 %
Neutro Abs: 5.4 10*3/uL (ref 1.7–7.7)
Neutrophils Relative %: 47 %
Platelets: 324 10*3/uL (ref 150–400)
RBC: 4.36 MIL/uL (ref 4.22–5.81)
RDW: 13.7 % (ref 11.5–15.5)
WBC: 11.4 10*3/uL — ABNORMAL HIGH (ref 4.0–10.5)
nRBC: 0 % (ref 0.0–0.2)

## 2023-04-15 LAB — CK: Total CK: 365 U/L (ref 49–397)

## 2023-04-15 LAB — I-STAT CG4 LACTIC ACID, ED: Lactic Acid, Venous: 1.1 mmol/L (ref 0.5–1.9)

## 2023-04-15 LAB — RAPID URINE DRUG SCREEN, HOSP PERFORMED
Amphetamines: POSITIVE — AB
Barbiturates: NOT DETECTED
Benzodiazepines: NOT DETECTED
Cocaine: NOT DETECTED
Opiates: POSITIVE — AB
Tetrahydrocannabinol: NOT DETECTED

## 2023-04-15 LAB — ETHANOL: Alcohol, Ethyl (B): <10 mg/dL (ref <10)

## 2023-04-15 LAB — RPR: RPR Ser Ql: NONREACTIVE

## 2023-04-15 LAB — C-REACTIVE PROTEIN: CRP: 2.6 mg/dL — ABNORMAL HIGH (ref <1.0)

## 2023-04-15 LAB — SEDIMENTATION RATE: Sed Rate: 27 mm/h — ABNORMAL HIGH (ref 0–16)

## 2023-04-15 LAB — HIV ANTIBODY (ROUTINE TESTING W REFLEX): HIV Screen 4th Generation wRfx: NONREACTIVE

## 2023-04-15 MED ORDER — DOXYCYCLINE HYCLATE 100 MG PO CAPS: 100.0000 mg | ORAL_CAPSULE | Freq: Two times a day (BID) | ORAL | 0 refills | Status: DC

## 2023-04-15 MED ORDER — DOXYCYCLINE HYCLATE 100 MG PO CAPS: 100.0000 mg | ORAL_CAPSULE | Freq: Two times a day (BID) | ORAL | 0 refills | Status: AC

## 2023-04-15 MED ORDER — DOXYCYCLINE HYCLATE 100 MG PO TABS
100.0000 mg | ORAL_TABLET | Freq: Once | ORAL | Status: AC
  Administered 2023-04-15: 100 mg via ORAL
  Filled 2023-04-15: qty 1

## 2023-04-15 MED ORDER — SODIUM CHLORIDE 0.9 % IV BOLUS
500.0000 mL | Freq: Once | INTRAVENOUS | Status: AC
  Administered 2023-04-15: 500 mL via INTRAVENOUS
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
## 2023-04-15 NOTE — ED Notes (Signed)
Pt reminded of need for urine sample. Given beverage and states he will try again soon.

## 2023-04-15 NOTE — ED Provider Notes (Signed)
Emergency Department Provider Note   I have reviewed the triage vital signs and the nursing notes.   HISTORY  Chief Complaint Arm Injury   HPI Ronald Greene is a 27 y.o. male past history of IV drug use presents emergency department with right wrist swelling and sore/wound dorsal aspect of the right wrist.  States he has had several falls and is unsure if he injured it versus an infection.  He does inject drugs in the arm but never in the hand.  He denies skin popping.  He mainly uses amphetamines IV but occasionally opiates as well.  No fever. Swelling and tingling noted to the hand. Symptoms present for the last 4 days.    Past Medical History:  Diagnosis Date   Eczema    Heroin abuse (HCC)    Syphilis     Review of Systems  Constitutional: No fever/chills Cardiovascular: Denies chest pain. Respiratory: Denies shortness of breath. Gastrointestinal: No abdominal pain.  No nausea, no vomiting.  No diarrhea.  No constipation. Genitourinary: Negative for dysuria. Musculoskeletal: Positive right wrist pain/swelling.  Skin: Positive wound to the right wrist.  Neurological: Negative for headaches.   ____________________________________________   PHYSICAL EXAM:  VITAL SIGNS: ED Triage Vitals  Encounter Vitals Group     BP 04/15/23 0200 (!) 156/84     Pulse Rate 04/15/23 0200 (!) 107     Resp 04/15/23 0200 16     Temp 04/15/23 0200 98.1 F (36.7 C)     Temp Source 04/15/23 0200 Oral     SpO2 04/15/23 0200 98 %   Constitutional: Alert and oriented. Well appearing and in no acute distress. Eyes: Conjunctivae are normal.  Head: Atraumatic. Nose: No congestion/rhinnorhea. Mouth/Throat: Mucous membranes are moist.   Neck: No stridor.  Cardiovascular: Normal rate, regular rhythm. Good peripheral circulation. Grossly normal heart sounds.   Respiratory: Normal respiratory effort.  No retractions. Lungs CTAB. Gastrointestinal: Soft and nontender. No distention.   Musculoskeletal: Swelling to the right wrist and hand diffusely. 1.5 cm circular wound to the dorsal aspect of the right wrist. Slight decreased ROM of the wrist due to swelling without focal warmth or erythema.  Neurologic:  Normal speech and language.  Skin:  Skin is warm, dry and intact. No rash noted.  ____________________________________________   LABS (all labs ordered are listed, but only abnormal results are displayed)  Labs Reviewed  COMPREHENSIVE METABOLIC PANEL - Abnormal; Notable for the following components:      Result Value   Glucose, Bld 108 (*)    AST 67 (*)    ALT 115 (*)    All other components within normal limits  CBC WITH DIFFERENTIAL/PLATELET - Abnormal; Notable for the following components:   WBC 11.4 (*)    Lymphs Abs 4.5 (*)    Monocytes Absolute 1.3 (*)    All other components within normal limits  RAPID URINE DRUG SCREEN, HOSP PERFORMED - Abnormal; Notable for the following components:   Opiates POSITIVE (*)    Amphetamines POSITIVE (*)    All other components within normal limits  SEDIMENTATION RATE - Abnormal; Notable for the following components:   Sed Rate 27 (*)    All other components within normal limits  CULTURE, BLOOD (ROUTINE X 2)  CULTURE, BLOOD (ROUTINE X 2)  ETHANOL  CK  HIV ANTIBODY (ROUTINE TESTING W REFLEX)  RPR  C-REACTIVE PROTEIN  I-STAT CG4 LACTIC ACID, ED    ____________________________________________  RADIOLOGY  DG Wrist Complete Right  Result  Date: 04/15/2023 CLINICAL DATA:  Wrist injury following fall, initial encounter EXAM: RIGHT WRIST - COMPLETE 3+ VIEW COMPARISON:  None Available. FINDINGS: Considerable soft tissue swelling is noted over the wrist posteriorly. No acute fracture or dislocation is noted. No other focal abnormality is noted. IMPRESSION: Soft tissue swelling without acute bony abnormality. Electronically Signed   By: Alcide Clever M.D.   On: 04/15/2023 03:12     ____________________________________________   PROCEDURES  Procedure(s) performed:   Procedures  None  ____________________________________________   INITIAL IMPRESSION / ASSESSMENT AND PLAN / ED COURSE  Pertinent labs & imaging results that were available during my care of the patient were reviewed by me and considered in my medical decision making (see chart for details).   This patient is Presenting for Evaluation of wrist pain/swelling, which does require a range of treatment options, and is a complaint that involves a high risk of morbidity and mortality.  The Differential Diagnoses fracture, cellulitis, septic joint, DVT, etc.  Critical Interventions-    Medications  sodium chloride 0.9 % bolus 500 mL (0 mLs Intravenous Stopped 04/15/23 0423)  doxycycline (VIBRA-TABS) tablet 100 mg (100 mg Oral Given 04/15/23 4098)    Reassessment after intervention:  pain improved.    Clinical Laboratory Tests Ordered, included mild leukocytosis to 11.4. sed rate slightly elevated. CK normal.   Radiologic Tests Ordered, included wrist XR. I independently interpreted the images and agree with radiology interpretation.   Cardiac Monitor Tracing which shows tachycardia.    Social Determinants of Health Risk patient is an IVDA.   Medical Decision Making: Summary:  Patient presents to the emergency department with right wrist and hand swelling.  He is an IV drug user.  Arrives with no fever but mild tachycardia.  Will need x-ray to evaluate for underlying trauma type injury but my suspicion for infection is increased.  Clinically.  Patient does not have an exam consistent with septic joint, although considered.  Reevaluation with update and discussion with patient. Plan to start abx PO. Strict ED return precautions discussed with worsening pain, fever, chills, etc.  Patient's presentation is most consistent with acute presentation with potential threat to life or bodily function.    Disposition: discharge  ____________________________________________  FINAL CLINICAL IMPRESSION(S) / ED DIAGNOSES  Final diagnoses:  Cellulitis of right upper extremity    Note:  This document was prepared using Dragon voice recognition software and may include unintentional dictation errors.  Alona Bene, MD, [facility]    Faryal Marxen, Arlyss Repress, MD 04/15/23 [phone]

## 2023-04-15 NOTE — ED Notes (Addendum)
Lab called at 11/22 at 3:53pm. Stated he had 1/4 bottles positive gram negative rod. EDP Dr. Rodena Medin notified. Instructed to call patient and inform him if he worsens to come back for further evaluation and to take antibiotics as prescribed.

## 2023-04-15 NOTE — ED Triage Notes (Signed)
Pt reports falling 3 days ago and now has right shoulder & hand pain. Swollen in triage. Pt reports his legs have been giving out on him recently and when he fell he caught himself with that arm.

## 2023-04-15 NOTE — ED Notes (Signed)
Patient aware of need for urine sample ?

## 2023-04-15 NOTE — Discharge Instructions (Addendum)
I am treating your right arm and wrist swelling as if it is a superficial skin infection, cellulitis.  Please the area clean and dry.  Return to the emergency department if pain or swelling worsen.  Also return with fever.  You should continue antibiotic as instructed until completing the course.

## 2023-04-18 LAB — CULTURE, BLOOD (ROUTINE X 2)
Culture: NO GROWTH
Special Requests: ADEQUATE

## 2023-04-18 NOTE — ED Notes (Addendum)
04/18/23 1320 lab called, culture correction, no organism growing. Spoke with S Smoot PA who is aware no orders given.

## 2023-04-20 LAB — CULTURE, BLOOD (ROUTINE X 2): Culture: NO GROWTH

## 2023-06-11 ENCOUNTER — Other Ambulatory Visit: Payer: Self-pay

## 2023-06-11 ENCOUNTER — Encounter (HOSPITAL_COMMUNITY): Payer: Self-pay

## 2023-06-11 ENCOUNTER — Emergency Department (HOSPITAL_COMMUNITY)
Admission: EM | Admit: 2023-06-11 | Discharge: 2023-06-11 | Disposition: A | Discharge status: Home / Self Care | Payer: MEDICAID | Attending: Emergency Medicine | Admitting: Emergency Medicine

## 2023-06-11 DIAGNOSIS — Z139 Encounter for screening, unspecified: Secondary | ICD-10-CM | POA: Diagnosis present

## 2023-06-11 LAB — CBC WITH DIFFERENTIAL/PLATELET
Abs Immature Granulocytes: 0.03 10*3/uL (ref 0.00–0.07)
Basophils Absolute: 0 10*3/uL (ref 0.0–0.1)
Basophils Relative: 0 %
Eosinophils Absolute: 0.1 10*3/uL (ref 0.0–0.5)
Eosinophils Relative: 1 %
HCT: 39.8 % (ref 39.0–52.0)
Hemoglobin: 12.9 g/dL — ABNORMAL LOW (ref 13.0–17.0)
Immature Granulocytes: 0 %
Lymphocytes Relative: 44 %
Lymphs Abs: 4.2 10*3/uL — ABNORMAL HIGH (ref 0.7–4.0)
MCH: 30.4 pg (ref 26.0–34.0)
MCHC: 32.4 g/dL (ref 30.0–36.0)
MCV: 93.9 fL (ref 80.0–100.0)
Monocytes Absolute: 1.2 10*3/uL — ABNORMAL HIGH (ref 0.1–1.0)
Monocytes Relative: 13 %
Neutro Abs: 4.1 10*3/uL (ref 1.7–7.7)
Neutrophils Relative %: 42 %
Platelets: 372 10*3/uL (ref 150–400)
RBC: 4.24 MIL/uL (ref 4.22–5.81)
RDW: 13.7 % (ref 11.5–15.5)
WBC: 9.6 10*3/uL (ref 4.0–10.5)
nRBC: 0 % (ref 0.0–0.2)

## 2023-06-11 LAB — URINALYSIS, W/ REFLEX TO CULTURE (INFECTION SUSPECTED)
Bacteria, UA: NONE SEEN
Bilirubin Urine: NEGATIVE
Glucose, UA: NEGATIVE mg/dL
Hgb urine dipstick: NEGATIVE
Ketones, ur: NEGATIVE mg/dL
Leukocytes,Ua: NEGATIVE
Nitrite: NEGATIVE
Protein, ur: NEGATIVE mg/dL
Specific Gravity, Urine: 1.013 (ref 1.005–1.030)
pH: 6 (ref 5.0–8.0)

## 2023-06-11 LAB — COMPREHENSIVE METABOLIC PANEL
ALT: 83 U/L — ABNORMAL HIGH (ref 0–44)
AST: 54 U/L — ABNORMAL HIGH (ref 15–41)
Albumin: 3.4 g/dL — ABNORMAL LOW (ref 3.5–5.0)
Alkaline Phosphatase: 60 U/L (ref 38–126)
Anion gap: 11 (ref 5–15)
BUN: 16 mg/dL (ref 6–20)
CO2: 24 mmol/L (ref 22–32)
Calcium: 8.8 mg/dL — ABNORMAL LOW (ref 8.9–10.3)
Chloride: 99 mmol/L (ref 98–111)
Creatinine, Ser: 0.95 mg/dL (ref 0.61–1.24)
GFR, Estimated: >60 mL/min (ref >60)
Glucose, Bld: 107 mg/dL — ABNORMAL HIGH (ref 70–99)
Potassium: 4 mmol/L (ref 3.5–5.1)
Sodium: 134 mmol/L — ABNORMAL LOW (ref 135–145)
Total Bilirubin: 0.4 mg/dL (ref 0.0–1.2)
Total Protein: 7.8 g/dL (ref 6.5–8.1)

## 2023-06-11 LAB — I-STAT CG4 LACTIC ACID, ED: Lactic Acid, Venous: 1 mmol/L (ref 0.5–1.9)

## 2023-06-11 NOTE — ED Provider Notes (Signed)
Emergency Department Provider Note   I have reviewed the triage vital signs and the nursing notes.   HISTORY  Chief Complaint Medication Refill   HPI Ronald Greene is a 28 y.o. male with past history of heroin abuse and syphilis presents to the emergency department requesting refill of his previously prescribed antibiotic.  Tells me he was seen sometime in the past and prescribed antibiotic but never actually picked it up.  He has some darkening discoloration to both hands.  No abscess or painful lesions.  No fevers or chills.  No chest pain or shortness of breath. No other infection symptoms.    Past Medical History:  Diagnosis Date   Eczema    Heroin abuse (HCC)    Syphilis     Review of Systems  Constitutional: No fever/chills Cardiovascular: Denies chest pain. Respiratory: Denies shortness of breath. Gastrointestinal: No abdominal pain.  No nausea, no vomiting. Skin: Positive rash to the hands.  Neurological: Negative for headaches.  ____________________________________________   PHYSICAL EXAM:  VITAL SIGNS: ED Triage Vitals  Encounter Vitals Group     BP 06/11/23 0349 (!) 160/89     Pulse Rate 06/11/23 0349 (!) 103     Resp 06/11/23 0349 18     Temp 06/11/23 0349 97.9 F (36.6 C)     Temp src --      SpO2 06/11/23 0349 100 %     Weight 06/11/23 0351 285 lb (129.3 kg)     Height 06/11/23 0351 6' (1.829 m)   Constitutional: Alert and oriented. Well appearing and in no acute distress. Eyes: Conjunctivae are normal. Head: Atraumatic. Nose: No congestion/rhinnorhea. Mouth/Throat: Mucous membranes are moist.  Neck: No stridor.  Cardiovascular: Normal rate, regular rhythm. Good peripheral circulation. Grossly normal heart sounds.   Respiratory: Normal respiratory effort.  No retractions. Lungs CTAB. Gastrointestinal: Soft and nontender. No distention.  Musculoskeletal: No lower extremity tenderness nor edema. No gross deformities of  extremities. Neurologic:  Normal speech and language. No gross focal neurologic deficits are appreciated.  Skin:  Skin is warm, dry and intact. No rash noted.  ____________________________________________   LABS (all labs ordered are listed, but only abnormal results are displayed)  Labs Reviewed  COMPREHENSIVE METABOLIC PANEL - Abnormal; Notable for the following components:      Result Value   Sodium 134 (*)    Glucose, Bld 107 (*)    Calcium 8.8 (*)    Albumin 3.4 (*)    AST 54 (*)    ALT 83 (*)    All other components within normal limits  CBC WITH DIFFERENTIAL/PLATELET - Abnormal; Notable for the following components:   Hemoglobin 12.9 (*)    Lymphs Abs 4.2 (*)    Monocytes Absolute 1.2 (*)    All other components within normal limits  URINALYSIS, W/ REFLEX TO CULTURE (INFECTION SUSPECTED)  I-STAT CG4 LACTIC ACID, ED   ____________________________________________   PROCEDURES  Procedure(s) performed:   Procedures  None  ____________________________________________   INITIAL IMPRESSION / ASSESSMENT AND PLAN / ED COURSE  Pertinent labs & imaging results that were available during my care of the patient were reviewed by me and considered in my medical decision making (see chart for details).   This patient is Presenting for Evaluation of medication refill, which does require a range of treatment options, and is a complaint that involves a moderate risk of morbidity and mortality.  Medical Decision Making: Summary:  Patient presents emergency department requesting refill of antibiotic.  He never actually picked it up the first time.  It appears he was seen in July of this past year and prescribed antibiotic at that time for an acute rash/abscess.  No evidence of this today.  He does have some darkened discoloration to the hands but this does not appear infectious in etiology.  Did not appreciate a murmur or other findings to suspect endocarditis or sepsis/bacteremia.   Plan for resources regarding his substance use disorder and strict ED return precautions.  Patient's presentation is most consistent with acute, uncomplicated illness.   Disposition: discharge  ____________________________________________  FINAL CLINICAL IMPRESSION(S) / ED DIAGNOSES  Final diagnoses:  Encounter for medical screening examination    Note:  This document was prepared using Dragon voice recognition software and may include unintentional dictation errors.  Alona Bene, MD, [facility]    Rachael Zapanta, Arlyss Repress, MD 06/16/23 [phone]

## 2023-06-11 NOTE — ED Triage Notes (Addendum)
Pt states he was diagnosed with a blood infection a couple of months ago but did not pick up the prescription. Pt denies pain but states he has noticed that he has discoloration to hands sometimes. Pt states he has had other symptoms such as back pain, muffled hearing, intermittent numbness in toes, and nasal congestion.

## 2023-06-11 NOTE — Discharge Instructions (Signed)

## 2023-06-15 IMAGING — CR DG CHEST 2V
2 series · 2 of 2 positions shown · non-contrast
Comparison: Chest x-ray 02/01/2021.

CLINICAL DATA: 25-year-old male with history of chest pain.

EXAM:
CHEST - 2 VIEW

[chest pa]
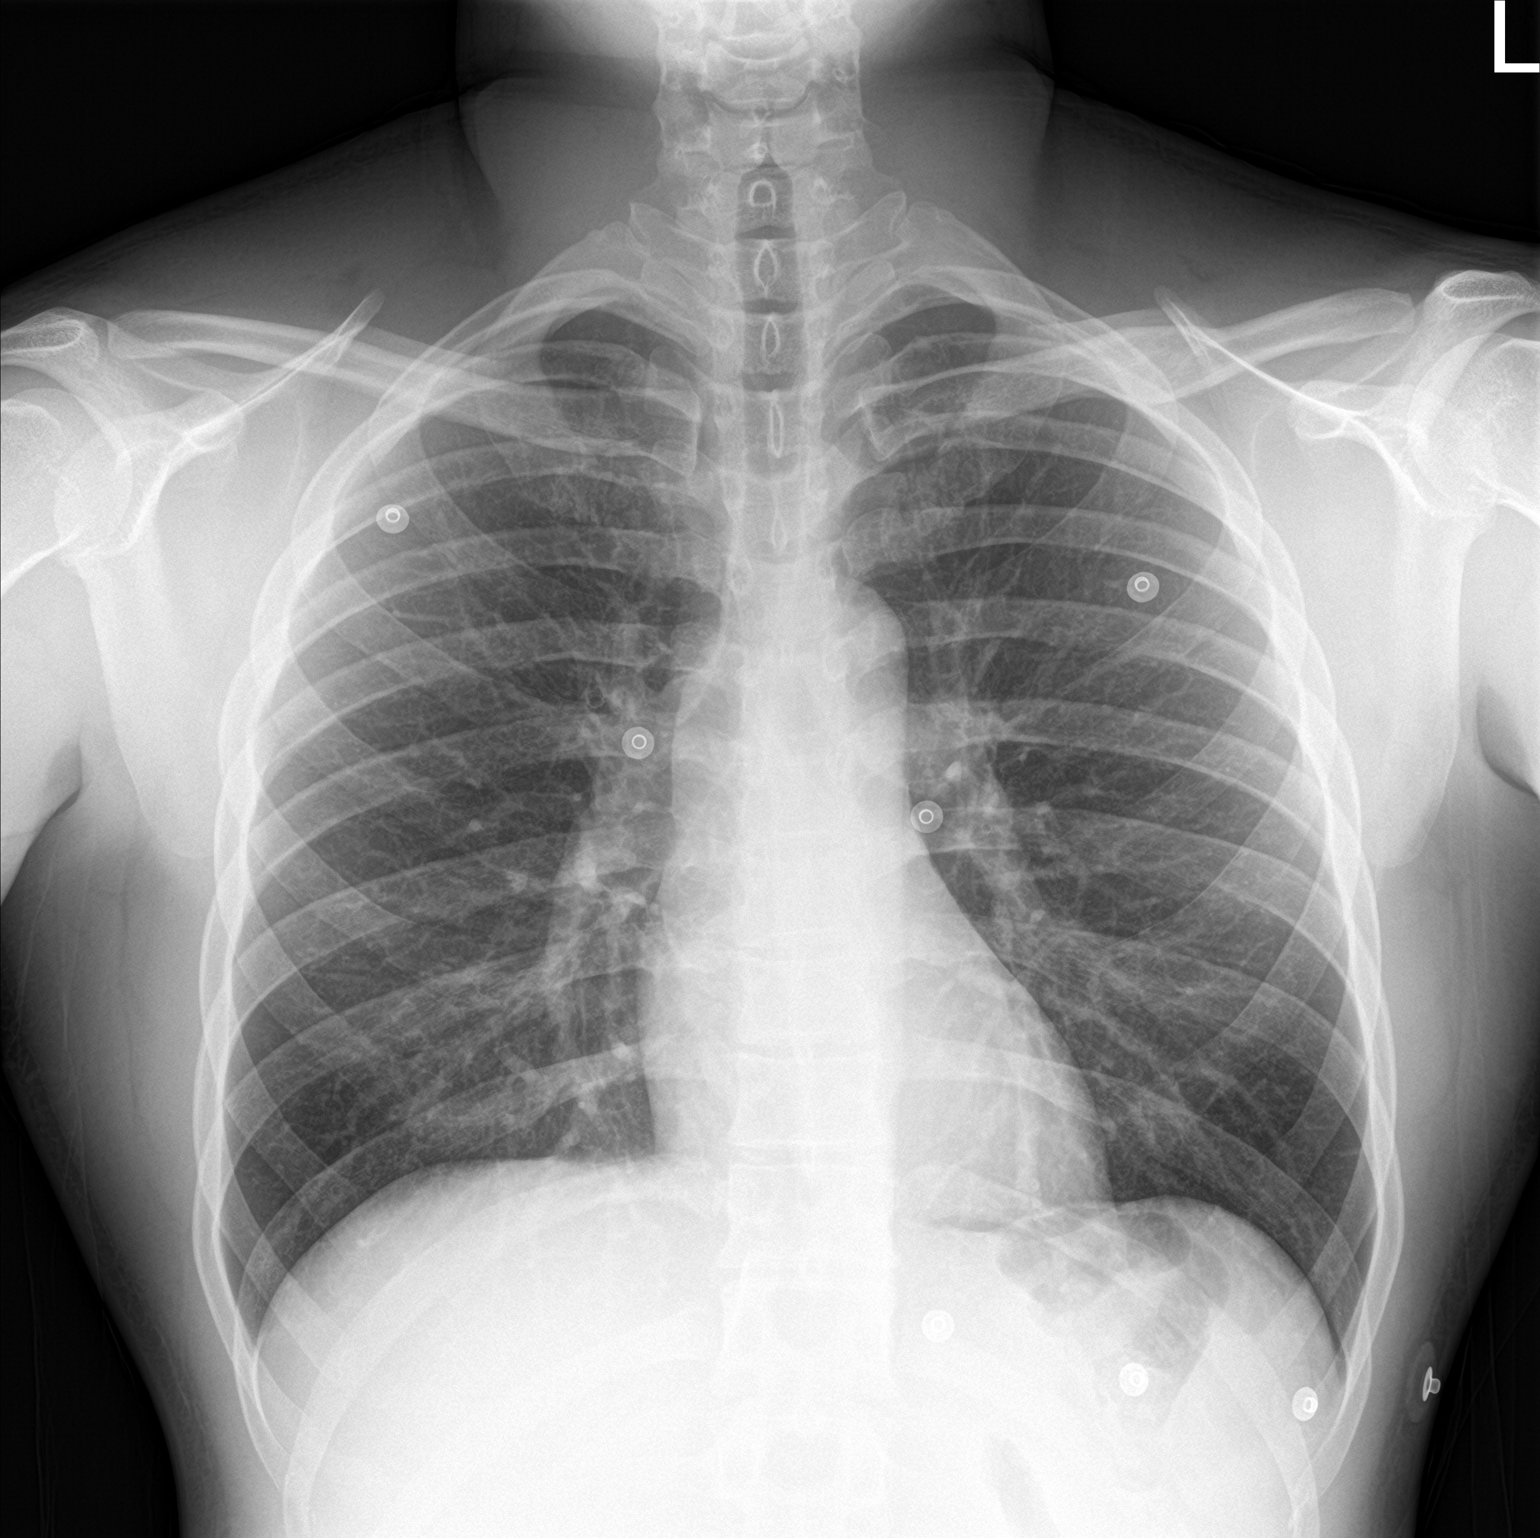

[chest lat]
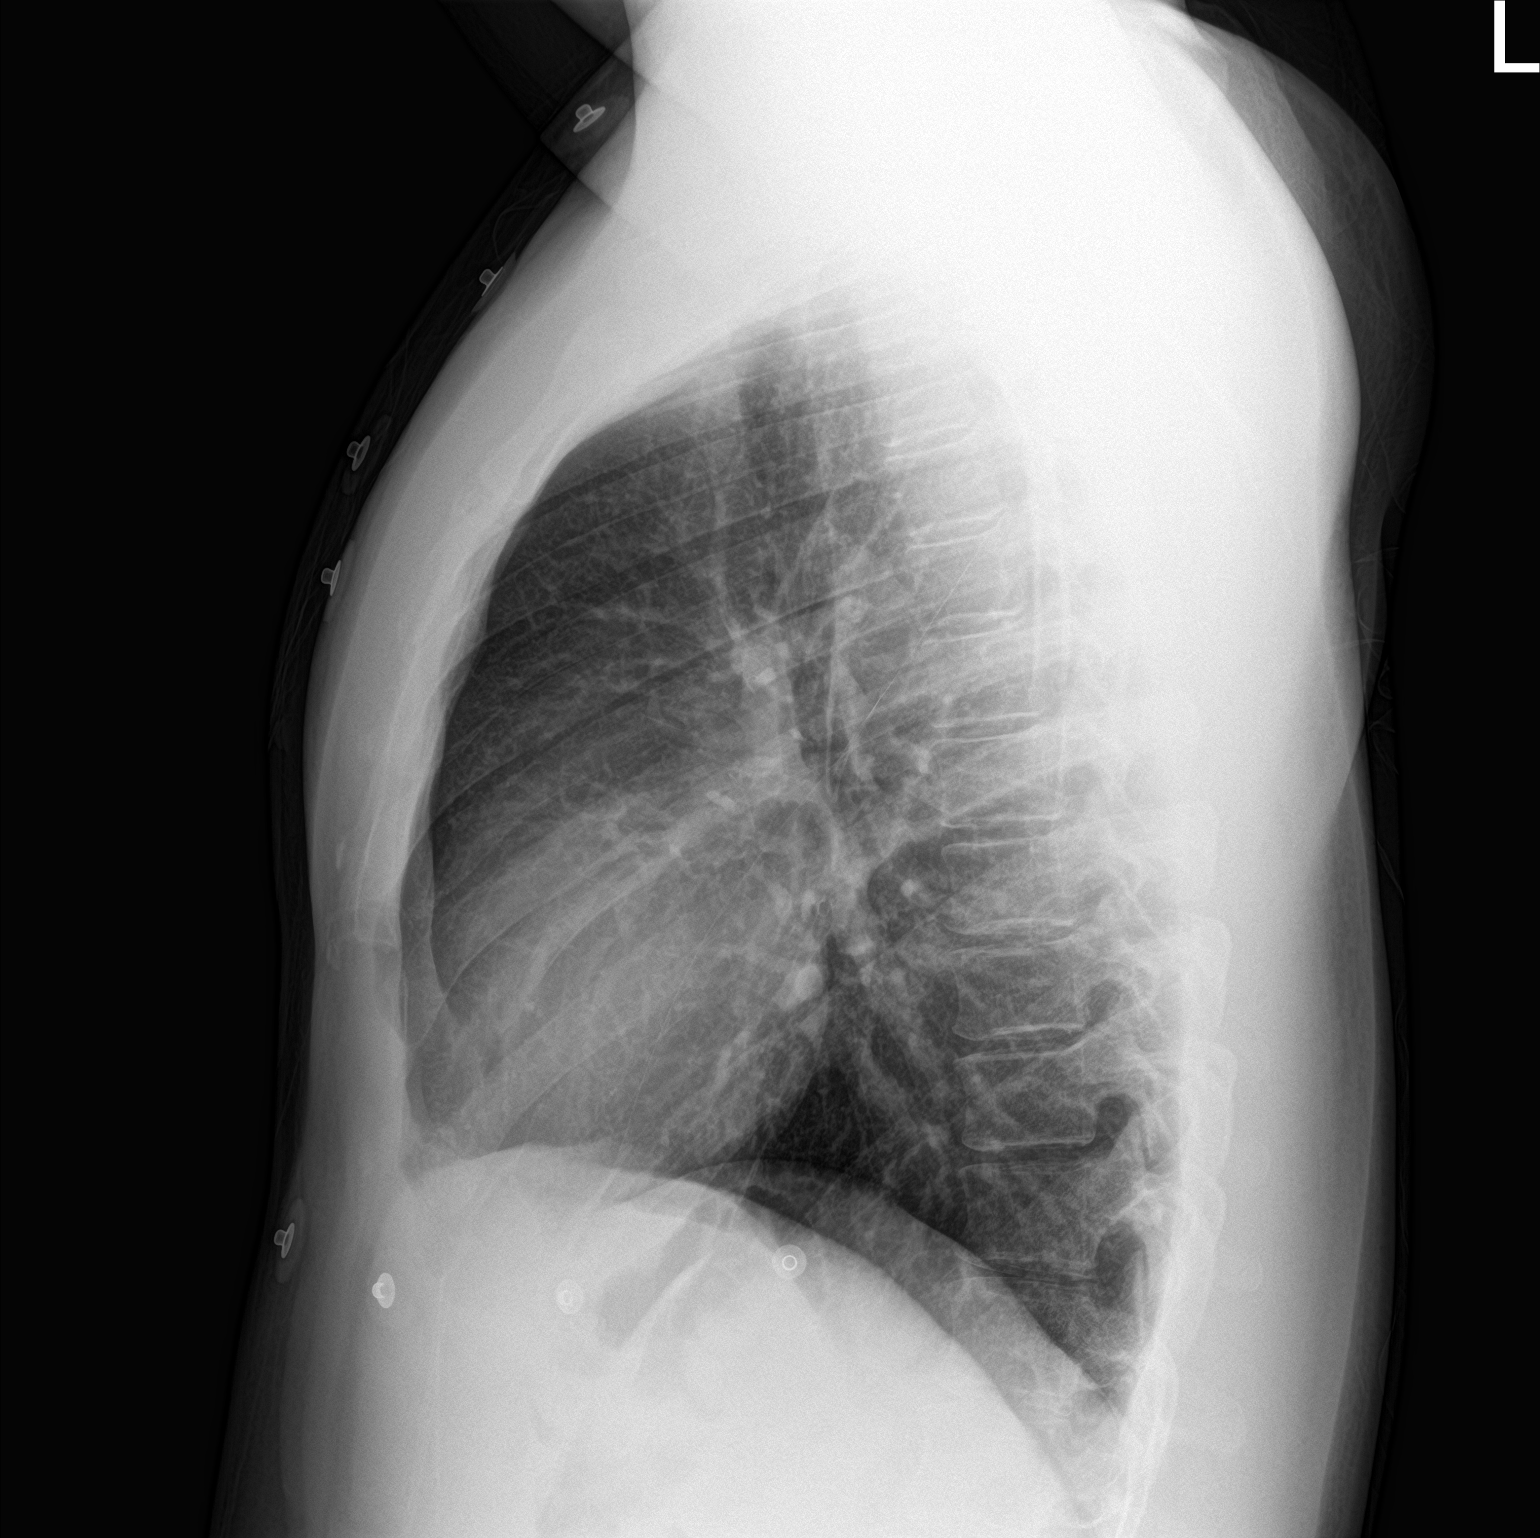

[2 of 2 positions shown; findings below may reference images not displayed]

FINDINGS: Lung volumes are normal. No consolidative airspace disease. No
pleural effusions. No pneumothorax. No pulmonary nodule or mass
noted. Pulmonary vasculature and the cardiomediastinal silhouette
are within normal limits.
IMPRESSION: No radiographic evidence of acute cardiopulmonary disease.
